# Patient Record
Sex: Female | Born: 1950 | Race: Black or African American | Hispanic: No | Marital: Married | Smoking: Never smoker
Health system: Southern US, Community
[De-identification: ages and names within clinical notes are randomized; demographics above are authoritative.]

## PROBLEM LIST (undated history)

## (undated) DIAGNOSIS — E785 Hyperlipidemia, unspecified: Secondary | ICD-10-CM

## (undated) DIAGNOSIS — T7840XA Allergy, unspecified, initial encounter: Secondary | ICD-10-CM

## (undated) DIAGNOSIS — I10 Essential (primary) hypertension: Secondary | ICD-10-CM

## (undated) HISTORY — DX: Essential (primary) hypertension: I10

## (undated) HISTORY — PX: ABDOMINAL HYSTERECTOMY: SHX81

## (undated) HISTORY — DX: Allergy, unspecified, initial encounter: T78.40XA

## (undated) HISTORY — PX: ABDOMINAL HYSTERECTOMY: SUR658

## (undated) HISTORY — DX: Hyperlipidemia, unspecified: E78.5

## (undated) HISTORY — PX: EYE SURGERY: SHX253

---

## 1997-12-21 ENCOUNTER — Other Ambulatory Visit: Admission: RE | Admit: 1997-12-21 | Discharge: 1997-12-21 | Payer: Self-pay | Admitting: Obstetrics and Gynecology

## 2001-03-06 ENCOUNTER — Encounter: Payer: Self-pay | Admitting: Family Medicine

## 2001-03-06 ENCOUNTER — Ambulatory Visit (HOSPITAL_COMMUNITY): Admission: RE | Admit: 2001-03-06 | Discharge: 2001-03-06 | Payer: Self-pay | Admitting: Family Medicine

## 2001-10-07 ENCOUNTER — Other Ambulatory Visit: Admission: RE | Admit: 2001-10-07 | Discharge: 2001-10-07 | Payer: Self-pay | Admitting: Obstetrics and Gynecology

## 2002-02-05 ENCOUNTER — Encounter (INDEPENDENT_AMBULATORY_CARE_PROVIDER_SITE_OTHER): Payer: Self-pay

## 2002-02-05 ENCOUNTER — Ambulatory Visit (HOSPITAL_COMMUNITY): Admission: RE | Admit: 2002-02-05 | Discharge: 2002-02-05 | Payer: Self-pay | Admitting: Obstetrics and Gynecology

## 2002-04-25 ENCOUNTER — Encounter: Payer: Self-pay | Admitting: Obstetrics and Gynecology

## 2002-04-25 ENCOUNTER — Ambulatory Visit (HOSPITAL_COMMUNITY): Admission: RE | Admit: 2002-04-25 | Discharge: 2002-04-25 | Payer: Self-pay | Admitting: Obstetrics and Gynecology

## 2003-01-27 ENCOUNTER — Other Ambulatory Visit: Admission: RE | Admit: 2003-01-27 | Discharge: 2003-01-27 | Payer: Self-pay | Admitting: Obstetrics and Gynecology

## 2003-06-16 ENCOUNTER — Encounter: Payer: Self-pay | Admitting: Internal Medicine

## 2003-06-16 ENCOUNTER — Inpatient Hospital Stay (HOSPITAL_COMMUNITY): Admission: AD | Admit: 2003-06-16 | Discharge: 2003-06-19 | Payer: Self-pay | Admitting: Obstetrics and Gynecology

## 2003-06-17 ENCOUNTER — Encounter: Payer: Self-pay | Admitting: Obstetrics & Gynecology

## 2003-06-18 ENCOUNTER — Encounter: Admission: RE | Admit: 2003-06-18 | Discharge: 2003-06-18 | Payer: Self-pay | Admitting: Internal Medicine

## 2003-07-01 ENCOUNTER — Ambulatory Visit: Admission: RE | Admit: 2003-07-01 | Discharge: 2003-07-01 | Payer: Self-pay | Admitting: Gynecology

## 2003-07-09 ENCOUNTER — Encounter (INDEPENDENT_AMBULATORY_CARE_PROVIDER_SITE_OTHER): Payer: Self-pay | Admitting: Specialist

## 2003-07-09 ENCOUNTER — Inpatient Hospital Stay (HOSPITAL_COMMUNITY): Admission: RE | Admit: 2003-07-09 | Discharge: 2003-07-11 | Payer: Self-pay | Admitting: Obstetrics and Gynecology

## 2003-07-09 ENCOUNTER — Encounter (INDEPENDENT_AMBULATORY_CARE_PROVIDER_SITE_OTHER): Payer: Self-pay | Admitting: *Deleted

## 2004-07-25 ENCOUNTER — Ambulatory Visit (HOSPITAL_COMMUNITY): Admission: RE | Admit: 2004-07-25 | Discharge: 2004-07-25 | Payer: Self-pay | Admitting: Obstetrics and Gynecology

## 2004-08-15 ENCOUNTER — Ambulatory Visit: Payer: Self-pay | Admitting: Gastroenterology

## 2004-08-18 ENCOUNTER — Ambulatory Visit: Payer: Self-pay | Admitting: Gastroenterology

## 2005-07-14 ENCOUNTER — Ambulatory Visit: Payer: Self-pay | Admitting: Gastroenterology

## 2005-07-28 ENCOUNTER — Ambulatory Visit: Payer: Self-pay | Admitting: Gastroenterology

## 2005-08-01 ENCOUNTER — Ambulatory Visit (HOSPITAL_COMMUNITY): Admission: RE | Admit: 2005-08-01 | Discharge: 2005-08-01 | Payer: Self-pay | Admitting: Obstetrics and Gynecology

## 2006-10-10 ENCOUNTER — Ambulatory Visit (HOSPITAL_COMMUNITY): Admission: RE | Admit: 2006-10-10 | Discharge: 2006-10-10 | Payer: Self-pay | Admitting: Obstetrics and Gynecology

## 2007-12-04 ENCOUNTER — Encounter: Admission: RE | Admit: 2007-12-04 | Discharge: 2007-12-04 | Payer: Self-pay | Admitting: Obstetrics and Gynecology

## 2009-01-22 ENCOUNTER — Ambulatory Visit (HOSPITAL_COMMUNITY): Admission: RE | Admit: 2009-01-22 | Discharge: 2009-01-22 | Payer: Self-pay | Admitting: Family Medicine

## 2010-02-10 ENCOUNTER — Ambulatory Visit (HOSPITAL_COMMUNITY): Admission: RE | Admit: 2010-02-10 | Discharge: 2010-02-10 | Payer: Self-pay | Admitting: Family Medicine

## 2010-07-19 ENCOUNTER — Encounter: Payer: Self-pay | Admitting: Gastroenterology

## 2010-07-25 ENCOUNTER — Encounter (INDEPENDENT_AMBULATORY_CARE_PROVIDER_SITE_OTHER): Payer: Self-pay | Admitting: *Deleted

## 2010-08-10 ENCOUNTER — Encounter (INDEPENDENT_AMBULATORY_CARE_PROVIDER_SITE_OTHER): Payer: Self-pay | Admitting: *Deleted

## 2010-08-12 ENCOUNTER — Ambulatory Visit
Admission: RE | Admit: 2010-08-12 | Discharge: 2010-08-12 | Payer: Self-pay | Source: Home / Self Care | Attending: Gastroenterology | Admitting: Gastroenterology

## 2010-08-26 ENCOUNTER — Other Ambulatory Visit: Payer: Self-pay | Admitting: Gastroenterology

## 2010-08-26 ENCOUNTER — Ambulatory Visit
Admission: RE | Admit: 2010-08-26 | Discharge: 2010-08-26 | Payer: Self-pay | Source: Home / Self Care | Attending: Gastroenterology | Admitting: Gastroenterology

## 2010-08-28 ENCOUNTER — Encounter: Payer: Self-pay | Admitting: Family Medicine

## 2010-09-02 ENCOUNTER — Encounter: Payer: Self-pay | Admitting: Gastroenterology

## 2010-09-08 NOTE — Letter (Signed)
Summary: Moviprep Instructions  [facility]  [address]   [place], [place] [address]   Phone: [phone]  Fax: [phone]       IZABELLE DAUS    1950/11/15    MRN: [id number]        Procedure Day /Date: 08-26-10     Arrival Time: 2:30 p.m.     Procedure Time: 3:30 p.m.     Location of Procedure:                    x   [facility] (4th Floor)   PREPARATION FOR COLONOSCOPY WITH MOVIPREP   Starting 5 days prior to your procedure 08-21-10 do not eat nuts, seeds, popcorn, corn, beans, peas,  salads, or any raw vegetables.  Do not take any fiber supplements (e.g. Metamucil, Citrucel, and Benefiber).  THE DAY BEFORE YOUR PROCEDURE         DATE: 08-25-10  DAY: Thursday  1.  Drink clear liquids the entire day-NO SOLID FOOD  2.  Do not drink anything colored red or purple.  Avoid juices with pulp.  No orange juice.  3.  Drink at least 64 oz. (8 glasses) of fluid/clear liquids during the day to prevent dehydration and help the prep work efficiently.  CLEAR LIQUIDS INCLUDE: Water Jello Ice Popsicles Tea (sugar ok, no milk/cream) Powdered fruit flavored drinks Coffee (sugar ok, no milk/cream) Gatorade Juice: apple, white grape, white cranberry  Lemonade Clear bullion, consomm, broth Carbonated beverages (any kind) Strained chicken noodle soup Hard Candy                             4.  In the morning, mix first dose of MoviPrep solution:    Empty 1 Pouch A and 1 Pouch B into the disposable container    Add lukewarm drinking water to the top line of the container. Mix to dissolve    Refrigerate (mixed solution should be used within 24 hrs)  5.  Begin drinking the prep at 5:00 p.m. The MoviPrep container is divided by 4 marks.   Every 15 minutes drink the solution down to the next mark (approximately 8 oz) until the full liter is complete.   6.  Follow completed prep with 16 oz of clear liquid of your choice (Nothing red or purple).   Continue to drink clear liquids until bedtime.  7.  Before going to bed, mix second dose of MoviPrep solution:    Empty 1 Pouch A and 1 Pouch B into the disposable container    Add lukewarm drinking water to the top line of the container. Mix to dissolve    Refrigerate  THE DAY OF YOUR PROCEDURE      DATE: 08-26-10 DAY: Friday  Beginning at 10:30 a.m. (5 hours before procedure):         1. Every 15 minutes, drink the solution down to the next mark (approx 8 oz) until the full liter is complete.  2. Follow completed prep with 16 oz. of clear liquid of your choice.    3. You may drink clear liquids until 1:30 p.m. (2 HOURS BEFORE PROCEDURE).   MEDICATION INSTRUCTIONS  Unless otherwise instructed, you should take regular prescription medications with a small sip of water   as early as possible the morning of your procedure.    Additional medication instructions: Do not take HCTZ day of procedure.  OTHER INSTRUCTIONS  You will need a responsible adult at least 61 years of age to accompany you and drive you home.   This person must remain in the waiting room during your procedure.  Wear loose fitting clothing that is easily removed.  Leave jewelry and other valuables at home.  However, you may wish to bring a book to read or  an iPod/MP3 player to listen to music as you wait for your procedure to start.  Remove all body piercing jewelry and leave at home.  Total time from sign-in until discharge is approximately 2-3 hours.  You should go home directly after your procedure and rest.  You can resume normal activities the  day after your procedure.  The day of your procedure you should not:   Drive   Make legal decisions   Operate machinery   Drink alcohol   Return to work  You will receive specific instructions about eating, activities and medications before you leave.    The above instructions have been reviewed and explained to me by    _______________________    I fully understand and can verbalize these instructions _____________________________ Date _________

## 2010-09-08 NOTE — Letter (Addendum)
Summary: Patient Notice- Polyp Results  [facility]  [address] [place], [place] [address]   Phone: [phone]  Fax: [phone]        September 02, 2010 MRN: [id number]    Taylor Holmes [address] [place], [place]  [address]    Dear [name],  I am pleased to inform you that the colon polyp(s) removed during your recent colonoscopy was (were) found to be benign (no cancer detected) upon pathologic examination.  I recommend you have a repeat colonoscopy examination in 5 years to look for recurrent polyps, as having colon polyps increases your risk for having recurrent polyps or even colon cancer in the future.  Should you develop new or worsening symptoms of abdominal pain, bowel habit changes or bleeding from the rectum or bowels, please schedule an evaluation with either your primary care physician or with me.  Continue treatment plan as outlined the day of your exam.  Please call us if you are having persistent problems or have questions about your condition that have not been fully answered at this time.  Sincerely,  Meryl Dare MD [facility]  This letter has been electronically signed by your physician.  Appended Document: Patient Notice- Polyp Results LETTER MAILED

## 2010-09-08 NOTE — Procedures (Addendum)
Summary: Colonoscopy  Patient: Taylor Holmes Note: All result statuses are Final unless otherwise noted.  Tests: (1) Colonoscopy (COL)   COL Colonoscopy           DONE     [facility]     [address]     [place], [place]  [address]           COLONOSCOPY PROCEDURE REPORT           PATIENT:  Taylor Holmes, Taylor Holmes  MR#:  [id number]     BIRTHDATE:  Dec 13, 1950, 59 yrs. old  GENDER:  female     ENDOSCOPIST:  Judie Petit T. Russella Dar, MD, [facility]           PROCEDURE DATE:  08/26/2010     PROCEDURE:  Colonoscopy with biopsy     ASA CLASS:  Class II     INDICATIONS:  1) Elevated Risk Screening  2) family history of     colon cancer: father at 42 and brother at 82.     MEDICATIONS:   Fentanyl 75 mcg IV, Versed 10 mg IV     DESCRIPTION OF PROCEDURE:   After the risks benefits and     alternatives of the procedure were thoroughly explained, informed     consent was obtained.  Digital rectal exam was performed and     revealed no abnormalities.   The LB [id number]-[id number] endoscope     was introduced through the anus and advanced to the cecum, which     was identified by both the appendix and ileocecal valve, without     limitations.  The quality of the prep was good, using MoviPrep.     The instrument was then slowly withdrawn as the colon was fully     examined.     <<PROCEDUREIMAGES>>           FINDINGS:  A sessile polyp was found in the mid transverse colon.     It was 4 mm in size. The polyp was removed using cold biopsy     forceps.  A sessile polyp was found in the sigmoid colon. It was 3     mm in size. The polyp was removed using cold biopsy forceps.  Mild     diverticulosis was found in the sigmoid colon.  A normal appearing     cecum, ileocecal valve, and appendiceal orifice were identified.     The ascending, hepatic flexure, splenic flexure, descending colon,     and rectum appeared unremarkable. Retroflexed views in the rectum     revealed no abnormalities.  The time to  cecum =  2  minutes. The     scope was then withdrawn (time =  12.67  min) from the patient and     the procedure completed.           COMPLICATIONS:  None           ENDOSCOPIC IMPRESSION:     1) 4 mm sessile polyp in the mid transverse colon     2) 3 mm sessile polyp in the sigmoid colon     3) Mild diverticulosis in the sigmoid colon           RECOMMENDATIONS:     1) Await pathology results     2) High fiber diet with liberal fluid intake.     3) Repeat Colonoscopy in 5 years pending pathology review           Judie Petit  Dellie Burns, MD, [facility]           CC:  Lynnea Ferrier, MD           n.     Rosalie DoctorVenita Lick. Chalsea Darko at 08/26/2010 03:38 PM           [place], [phone]  Note: An exclamation mark (!) indicates a result that was not dispersed into the flowsheet. Document Creation Date: 08/26/2010 3:38 PM _______________________________________________________________________  (1) Order result status: Final Collection or observation date-time: 08/26/2010 15:30 Requested date-time:  Receipt date-time:  Reported date-time:  Referring Physician:   Ordering Physician: Claudette Head [phone]) Specimen Source:  Source: Launa Grill Order Number: [id number] Lab site:   Appended Document: Colonoscopy     Procedures Next Due Date:    Colonoscopy: 08/2015

## 2010-09-08 NOTE — Letter (Signed)
Summary: Colonoscopy Letter  [facility]  [address]   [place], [place] [address]   Phone: [phone]  Fax: [phone]      July 19, 2010 MRN: [id number]   Taylor Holmes [address] [place], [place]  [address]   Dear Taylor Holmes,   According to your medical record, it is time for you to schedule a Colonoscopy. The American Cancer Society recommends this procedure as a method to detect early colon cancer. Patients with a family history of colon cancer, or a personal history of colon polyps or inflammatory bowel disease are at increased risk.  This letter has been generated based on the recommendations made at the time of your procedure. If you feel that in your particular situation this may no longer apply, please contact our office.  Please call our office at [phone] to schedule this appointment or to update your records at your earliest convenience.  Thank you for cooperating with [place] to provide you with the very best care possible.   Sincerely,   Claudette Head, M.D.  [facility] Gastroenterology Division [phone]

## 2010-09-08 NOTE — Letter (Signed)
Summary: Pre Visit Letter Revised  [facility]  [address] [place], [place] [address]   Phone: [phone]  Fax: [phone]        07/25/2010 MRN: [id number] Taylor Holmes [address] [place], [place]  [address]             Procedure Date:  08/26/2010   Welcome to the Gastroenterology Division at [facility].    You are scheduled to see a nurse for your pre-procedure visit on 08/12/2010 at 4:30 on the 3rd floor at [facility], [address]  We ask that you try to arrive at our office 15 minutes prior to your appointment time to allow for check-in.  Please take a minute to review the attached form.  If you answer "Yes" to one or more of the questions on the first page, we ask that you call the person listed at your earliest opportunity.  If you answer "No" to all of the questions, please complete the rest of the form and bring it to your appointment.    Your nurse visit will consist of discussing your medical and surgical history, your immediate family medical history, and your medications.   If you are unable to list all of your medications on the form, please bring the medication bottles to your appointment and we will list them.  We will need to be aware of both prescribed and over the counter drugs.  We will need to know exact dosage information as well.    Please be prepared to read and sign documents such as consent forms, a financial agreement, and acknowledgement forms.  If necessary, and with your consent, a friend or relative is welcome to sit-in on the nurse visit with you.  Please bring your insurance card so that we may make a copy of it.  If your insurance requires a referral to see a specialist, please bring your referral form from your primary care physician.  No co-pay is required for this nurse visit.     If you cannot keep your appointment, please call [phone] to cancel or reschedule prior to your appointment date.  This allows  [place] the opportunity to schedule an appointment for another patient in need of care.    Thank you for choosing [facility] for your medical needs.  We appreciate the opportunity to care for you.  Please visit [place] at our website  to learn more about our practice.  Sincerely, The Gastroenterology Division

## 2010-09-08 NOTE — Miscellaneous (Signed)
Summary: [facility] PV  Clinical Lists Changes  Medications: Added new medication of MOVIPREP 100 GM  SOLR (PEG-KCL-NACL-NASULF-NA ASC-C) As per prep instructions. - Signed Rx of MOVIPREP 100 GM  SOLR (PEG-KCL-NACL-NASULF-NA ASC-C) As per prep instructions.;  #1 x 0;  Signed;  Entered by: Ezra Sites RN;  Authorized by: Meryl Dare MD [facility];  Method used: [facility] [id number] [address] [place], [place], [place]  [address], Ph: [phone], Fax: [phone] Observations: Added new observation of NKA: T (08/12/2010 16:11)    Prescriptions: MOVIPREP 100 GM  SOLR (PEG-KCL-NACL-NASULF-NA ASC-C) As per prep instructions.  #1 x 0   Entered by:   Ezra Sites RN   Authorized by:   Meryl Dare MD [facility]   Signed by:   Ezra Sites RN on 08/12/2010   Method used:   [facility]  [address] [id number] ([facility]ail)       [address]       [place], [place]  [address]       Ph: [phone]       Fax: [phone]   RxID:   [id number]

## 2010-12-23 NOTE — Discharge Summary (Signed)
NAME:  Taylor Holmes, Taylor Holmes                      ACCOUNT NO.:  [id number]   MEDICAL RECORD NO.:  [id number]                   PATIENT TYPE:  INP   LOCATION:  9126                                 FACILITY:  [facility]   PHYSICIAN:  Osborn Coho, M.D.                DATE OF BIRTH:  15-Mar-1951   DATE OF ADMISSION:  06/16/2003  DATE OF DISCHARGE:  06/19/2003                                 DISCHARGE SUMMARY   CHIEF COMPLAINT UPON ADMISSION:  Left lower quadrant pain, nausea, and  vomiting.   ADMITTING DIAGNOSIS:  1. Left lower quadrant pain.  2. Left ovarian vein thrombosis.  3. Questionable findings of liver lesion.   DISCHARGE DIAGNOSES:  1. Left ovarian vein thrombosis.  2. Resolution of left lower quadrant pain.   HOSPITAL COURSE:  [name] was admitted on June 16, 2003.  She is a  60 year old gravida 4 para 2 who initially presented to [facility]  in [place] and was transferred to the [facility] secondary to being  a patient of Dr. Debria Garret.  The patient complained of worsening left lower  quadrant pain, nausea, and vomiting acutely that morning.  The patient was  admitted and treated with pain medicine and antiemetics.  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  The doctor at [facility] had a  question of ovarian torsion so an ultrasound was performed at the [facility] that showed no evidence of torsion and normal bilateral ovaries  with small simple left ovarian cyst and small right ovarian cyst with normal  bilateral blood flow.  The suggestion was made by radiology to do an MRI to  evaluate the liver lesions further.  The MRI showed the liver lesions to be  benign.  A hematology consult was called and Dr. Sharon Seller provided the  consultation.  The patient was anticoagulated secondary to left ovarian vein  thrombosis and pain was noted to resolve.  The patient was  discharged on  November 12 doing well without any further pain and was scheduled to follow  up with Dr. Stanford Breed and Dr. Stefano Gaul in two weeks.  The ultimate plan  was to consider hysterectomy on this patient.  The patient had a CA125 done  that was also to be repeated at [facility] on November 21.   DISCHARGE INSTRUCTIONS:  The patient to follow up in two weeks with Dr.  Stefano Gaul and Dr. Stanford Breed for further evaluation.  No further  anticoagulation was necessary and that was in agreement with hematologist,  Dr. Sharon Seller.  Follow-up was scheduled for two weeks.   DISCHARGE MEDICINES:  Prevacid, hydrochlorothiazide, and Percocet.  Osborn Coho, M.D.    AR/MEDQ  D:  08/06/2003  T:  08/06/2003  Job:  [id number]

## 2010-12-23 NOTE — Op Note (Signed)
[facility]  Patient:    Taylor Holmes, Taylor Holmes Visit Number: [id number] MRN: [id number]          Service Type: DSU Location: [facility] Attending Physician:  Leonard Schwartz Dictated by:   Janine Limbo, M.D. Proc. Date: 02/05/02 Admit Date:  02/05/2002 Discharge Date: 02/05/2002   CC:         Dr. Dorothey Baseman   Operative Report  PREOPERATIVE DIAGNOSES:       1. A 12 week size fibroid uterus.                               2. Menorrhagia.  POSTOPERATIVE DIAGNOSES:      1. A 12 week size fibroid uterus.                               2. Menorrhagia.                               3. Endometrial polyps.  OPERATION:                    1. Hysteroscopy.                               2. Dilatation and curettage.  SURGEON:                      Janine Limbo, M.D.  ANESTHESIA:                   Monitored anesthetic control and paracervical                               block using 0.5% Marcaine.  DISPOSITION:                  The patient is a 60 year old female with a known history of fibroids.  She is having heavy uterine bleeding.  She does not desire hysterectomy, and she is hoping that she will indeed become postmenopausal soon, and that her cycles will naturally stop on their own. She understands the indications for todays procedure, and she accepts the risks of, but not limited to anesthetic complications, bleeding, infection, and possible damage to the surrounding organs.  FINDINGS:                     The uterus was 12 weeks size and it contained multiple fibroids.  Inspection of the endometrial cavity showed endometrial polyps.  DESCRIPTION OF PROCEDURE:     The patient was taken to the operating room where she was given medication through her IV line.  The patients abdomen, perineum, and vagina were prepped with multiple layers of Betadine.  A Foley catheter was placed in the bladder.  The patient was sterilely  draped. Examination under anesthesia was performed.  A paracervical block was placed using 10 cc of 0.5% Marcaine.  The endocervical canal was then curetted.  The uterus sounded to 12 cm.  The cervix was gradually dilated.  The diagnostic hysteroscope was inserted and the cavity was inspected.  The patient was found to have polyps within the endometrial cavity.  The hysteroscope was removed and  the cavity was then curetted until it was felt to be clean.  The cavity was also explored using Randall stone forceps.  The polyps were removed.  The hysteroscope was inserted once again and the cavity did not show evidence of any further polyps.  The cavity appeared to be relatively clear.  The instruments were removed.  The patient was returned to the supine position and then awakened from her anesthetic.  She was taken to the recovery room in stable condition.  She tolerated her procedure well.  The estimated blood loss was 30 cc.  There was 140 cc fluid deficit during hysteroscopy.  FOLLOWUP INSTRUCTIONS:        The patient will return to see Dr. Stefano Gaul in two to three weeks for follow up examination.  She was given a prescription for Tylenol No. 3 and she will take one to two tablets every four hours as needed for pain.  She was given a copy of the postoperative instruction sheet as prepared by the [facility] for patients who have undergone a dilatation and curettage. Dictated by:   Janine Limbo, M.D. Attending Physician:  Leonard Schwartz DD:  02/05/02 TD:  02/08/02 Job: [id number]

## 2010-12-23 NOTE — Discharge Summary (Signed)
NAME:  Taylor Holmes, Taylor Holmes                      ACCOUNT NO.:  [id number]   MEDICAL RECORD NO.:  [id number]                   PATIENT TYPE:  INP   LOCATION:  0466                                 FACILITY:  [facility]   PHYSICIAN:  Janine Limbo, M.D.            DATE OF BIRTH:  1951-07-26   DATE OF ADMISSION:  07/09/2003  DATE OF DISCHARGE:  07/11/2003                                 DISCHARGE SUMMARY   DISCHARGE DIAGNOSES:  1. A 12 week size fibroid uterus.  2. Dysmenorrhea.  3. Pelvic pain.  4. Pelvic adhesions.  5. Ovarian cyst.  6. Postoperative anemia (hemoglobin 9.8).  7. History of left ovarian vein thrombosis.   PROCEDURES:  On July 09, 2003, total abdominal hysterectomy, bilateral  salpingo-oophorectomy, cystoscopy.   HISTORY OF PRESENT ILLNESS:  Taylor Holmes is a 60 year old female, para 2-  0-2-2, who was admitted with the above mentioned diagnoses.  The patient has  a history of left ovarian vein thrombosis and she was noted to have an  elevated CA-125.  She was seen in consultation by a GYN oncologist, and  because her CA-125 declined spontaneously without treatment other than  anticoagulation, it was believed that malignancy was extremely unlikely.  Nevertheless, the patient did want to proceed with surgery because of many  years of discomfort and because of her pelvic pain.  Please see our dictated  history and physical exam for details.   HOSPITAL COURSE:  On the day of admission, the patient underwent a total  abdominal hysterectomy and bilateral salpingo-oophorectomy.  Operative  findings included a 12 week size multi-fibroid uterus, normal fallopian  tubes, and normal ovaries except for moderate adhesions between the ovaries  and the side walls.  There was no evidence of thrombosis of the left ovarian  vein.  Cystoscopy was performed because of the pelvic adhesive disease, and  her ureters were thought to be normal.  The patient tolerated her procedure  well.  Her postoperative hemoglobin was 9.8 (preoperative hemoglobin was  12.7).  The patient did have a temperature to 100.5 on postoperative day #2,  but then she quickly defervesced.  She tolerated a regular diet by  postoperative day #2.  Her pain medications were thought to be adequate.  The decision was made to discharge the patient home on postoperative day #2.  She was in stable condition.   DISCHARGE MEDICATIONS:  1. Vicodin one or two tablets every four hours as needed for pain.  2. Phenergan 25 mg every six hours as needed for nausea.  3. Iron 325 mg twice each day for six weeks.  4. Effexor 25 mg one tablet twice each day for management of hot flashes.   DISCHARGE INSTRUCTIONS:  1. The patient will return to see Dr. Stefano Holmes in two to three days to have     the abdominal staples removed and her Jackson-Pratt drain removed.  2. She will return in four to six weeks  for followup examination.  3. She will refrain from driving for two weeks, heavy lifting for four     weeks, and intercourse for six weeks.  4. She will increase the fiber and the fluids in her diet because of her     iron therapy.  5. She will call for questions or concerns.   Final pathology report:  255 g uterus with multiple fibroids.  The ovaries  were without pathology.  Cytology fluid from the pelvis showed no evidence  of malignant cells.                                               Janine Limbo, M.D.    AVS/MEDQ  D:  07/30/2003  T:  07/30/2003  Job:  [id number]   cc:   Taylor Holmes, M.D.

## 2010-12-23 NOTE — H&P (Signed)
NAME:  Taylor Holmes, Taylor Holmes                      ACCOUNT NO.:  [id number]   MEDICAL RECORD NO.:  [id number]                   PATIENT TYPE:  INP   LOCATION:  NA                                   FACILITY:  [facility]   PHYSICIAN:  Janine Limbo, M.D.            DATE OF BIRTH:  August 01, 1951   DATE OF ADMISSION:  07/09/2003  DATE OF DISCHARGE:                                HISTORY & PHYSICAL   HISTORY OF PRESENT ILLNESS:  Ms. Sandstrom is a 61 year old female, para 2-  0-0-2, who is admitted to the [facility] for a total abdominal  hysterectomy and bilateral salpingo-oophorectomy.  The patient has been  followed at the [facility] and Gynecology division of  [facility] for this pregnancy that has been complicated  by a long history of fibroids.  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  This  was associated with severe pain.  That pain has subsequently resolved.  The  patient was briefly treated with Lovenox but long-term therapy was not  thought to be necessary.   As a part of that evaluation, the patient had a CA125 performed with a value  of 400.  A repeat value, however, was 109.  The patient was seen by a GYN  oncologist and the thought was that she most likely had an elevated CA125  because of her fibroids and the left ovarian vein syndrome.  A CT scan of  the pelvis did not reveal any other signs of potential malignancy.  It was  felt that no further cancer evaluation was necessary.   The patient does have a history of severe dysmenorrhea each month.  She has  had a prior D&C in the past.  She has also had a tubal ligation in the past.   OBSTETRICAL HISTORY:  The patient has had two term vaginal deliveries and  two elective pregnancy terminations.   ALLERGIES:  None known.   PAST MEDICAL HISTORY:  1. Hypertension.  2. Gastroesophageal reflux disease.   CURRENT MEDICATIONS:  Travatan, hydrochlorothiazide, and Prevacid.   REVIEW OF SYMPTOMS:  Please see history of present illness.   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  She denies cigarette use,  alcohol use, and recreational drug use.   FAMILY HISTORY:  The patient has a brother with colon cancer.  There is a  question of whether or not her mother had cervix cancer.  The patient has an  aunt with breast cancer.   PHYSICAL EXAMINATION:  VITAL SIGNS:  Weight is 145 pounds.  Height 5 feet 2  inches.  HEENT:  Within normal limits.  CHEST:  Clear.  HEART:  Regular rate and rhythm.  BREASTS:  Without masses.  ABDOMEN:  Soft and nontender.  There is no evidence of ascites present.  No  masses are appreciated.  EXTREMITIES:  Grossly normal.  NEUROLOGICAL:  Grossly normal.  PELVIC:  External genitalia is normal.  The vagina is normal and nontender.  Cervix is normal.  The uterus 12 to 14 weeks size, irregular, and mobile.  Adnexa shows no masses appreciated.  Rectovaginal exam confirms.   ASSESSMENT:  1. A 12 to 14-week sized fibroid uterus.  2. Severe dysmenorrhea.   PLAN:  The patient will undergo a total abdominal hysterectomy and bilateral  salpingo-oophorectomy.  She understands the indications for her procedure  and she accepts the risks of, but not limited to, anesthetic complications,  bleeding, infections, and possible damage to the surrounding organs.                                               Janine Limbo, M.D.    AVS/MEDQ  D:  07/08/2003  T:  07/08/2003  Job:  [id number]   cc:   Elvina Sidle, M.D.  [address] [place]  [place] [address]  Fax: [phone]

## 2010-12-23 NOTE — Op Note (Signed)
NAME:  Taylor Holmes, Taylor Holmes                      ACCOUNT NO.:  [id number]   MEDICAL RECORD NO.:  [id number]                   PATIENT TYPE:  INP   LOCATION:  0466                                 FACILITY:  [facility]   PHYSICIAN:  Janine Limbo, M.D.            DATE OF BIRTH:  12/30/1950   DATE OF PROCEDURE:  07/09/2003  DATE OF DISCHARGE:                                 OPERATIVE REPORT   PREOPERATIVE DIAGNOSES:  1. 12 week fibroid uterus.  2. Dysmenorrhea.  3. Pelvic pain.   POSTOPERATIVE DIAGNOSES:  1. 12 week fibroid uterus.  2. Dysmenorrhea.  3. Pelvic pain.  4. Pelvic adhesions.   PROCEDURE:  1. Total abdominal hysterectomy.  2. Bilateral salpingo-oophorectomy.  3. Cystoscopy.   SURGEON:  Janine Limbo, M.D.   FIRST ASSISTANT:  Elmira J. Adline Peals.   ANESTHESIA:  General.   DISPOSITION:  Ms. Higuchi is a 60 year old female, para 2-0-2-2, who  presents for a total abdominal hysterectomy and bilateral salpingo-  oophorectomy. The patient has been followed at the [facility] and Gynecology Division of [facility] for a  long history of fibroids. She was recently admitted to the hospital because  of abdominal pain. 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  This was associated with severe abdominal pain. She was treated with Lovenox  and the pain subsequently improved but did not completely resolve. After a  part of her evaluation, she was found to have a negative lupus anticoagulant  and negative studies for clotting factors. She was, however, found to have a  CA 125 that was 400. Repeat value was 109. The patient was evaluated by a  GYN oncologist and it was thought that the elevation in the CA 125 was most  likely a result of her fibroid and the left ovarian vein thrombosis. It was  felt that a malignancy was extremely unlikely. In addition, she did have a  CT scan and an MRI of her abdomen and pelvis and no  suspicious lesions were  noted. There was no evidence of ascites. The patient presents at this time  for definitive therapy. She understands the indications for her procedure  and she also accepts the risks of, but not limited to, anesthetic  complications, bleeding, infections, and possible damage to the surrounding  organs.   FINDINGS:  The patient was noted to have a multifibroid, 12 week size  fibroid uterus. The fallopian tubes were normal except for defects from her  prior tubal ligation. The patient's ovaries appeared normal except for  moderate adhesions to the pelvic sidewalls. The bowel was carefully  inspected and there was no evidence of abnormality there. The periaortic  area was palpated and there was no adenopathy present. There was no  adenopathy along the iliac vessels. The upper abdomen was palpated and there  was no evidence of malignancy. There was no nodularity along the liver  or  the diaphragm. On cystoscopy, there were no lesions noted within the  bladder. Blue dye was seen from the ureters.   DESCRIPTION OF PROCEDURE:  The patient was taken to the operating room where  a general anesthetic was given. The patient's abdomen, perineum, and vagina  were prepped with multiple layers of Betadine. A Foley catheter was placed  in the bladder. The patient was sterilely draped. The lower abdomen was  injected with 10 mL of 0.25% Marcaine without epinephrine.  A low transverse  incision was made in the abdomen and carried sharply through the  subcutaneous tissue, the fascia, and the anterior peritoneum. Washings were  obtained from the abdominal cavity. These were sent to cytology for  evaluation. The upper abdomen was explored and no lesions were noted. The  bowel was explored and no lesions were noted. An abdominal wall retractor  was placed. The bowel was packed out of the pelvis. The uterus was elevated  into our operative field. The ureters were isolated bilaterally and  followed  through their course. They were felt to be away from our areas for surgery.  The round ligaments were clamped bilaterally, cut, suture ligated, and held  to the side. The uteroovarian ligaments were isolated bilaterally, clamped,  cut, sutured, and held to the side. The bladder flap was developed  anteriorly. Alternating from left to right, the uterine arteries,  parametrial tissues, paracervical tissues, uterosacral ligaments, and then  the vaginal angles were clamped, cut, sutured, and tied securely. The uterus  was transected from the apex of the vagina. The apex of the vagina was  closed using figure-of-eight sutures. There was some bleeding and hemostasis  was achieved using the Bovie cautery and free ties. We then isolated the  right infundibulopelvic ligament. The ligament was clamped, cut, free tied  and suture ligated. Hemostasis was noted to be adequate. An identical  procedure was carried out on the opposite side. Once again hemostasis was  adequate. The pelvis was irrigated. The pelvis was reperitonealized using a  running suture of 3-0 Vicryl. At this point, we felt we were finished with  the abdominal procedure. All instruments were removed. The anterior  peritoneum was closed using 3-0 Vicryl. The subcutaneous layer, the fascia  and the abdominal musculature were irrigated. Hemostasis was adequate. The  fascia was closed using a running suture of #0 Vicryl followed by three  interrupted sutures of #0 Vicryl. A Jackson-Pratt drain was placed in the  bottom of the subcutaneous layer and brought out through the right lower  quadrant. It was sutured into place using 3-0 silk. The subcutaneous layer  was closed using a running suture of #0 Vicryl. The skin was reapproximated  using skin staples. Sponge, needle, and instrument counts were correct on  two occasions. The estimated blood loss was 400 mL. The patient was noted to drain clear yellow urine. The patient was then  given 2 ampules of indigo  carmine dye. The diagnostic hysteroscope was inserted into the bladder and  the mucosa was carefully evaluated. Dye was noted to pass through the  ureters. The Foley catheter was then reinserted and the patient was awakened  from her anesthetic. She was taken to the recovery room in stable condition.  She was noted to drain clear, blue urine at this point. Sponge, needle and  instrument counts were correct for the procedure. #0 Vicryl was the suture  material used throughout except where otherwise mentioned.  Janine Limbo, M.D.    AVS/MEDQ  D:  07/09/2003  T:  07/10/2003  Job:  [id number]

## 2010-12-23 NOTE — Consult Note (Signed)
NAME:  Taylor Holmes, BLUMER                      ACCOUNT NO.:  [id number]   MEDICAL RECORD NO.:  [id number]                   PATIENT TYPE:  OUT   LOCATION:  GYN                                  FACILITY:  [facility]   PHYSICIAN:  De Blanch, M.D.         DATE OF BIRTH:  09/25/1950   DATE OF CONSULTATION:  07/01/2003  DATE OF DISCHARGE:                                   CONSULTATION   [age] year-old African-American seen in consultation at the request of  Dr. Dierdre Forth.  The patient developed left lower quadrant pain  recently and in the course of evaluation a CT scan was obtained showing a  left ovarian vein thrombosis.  The patient was treated with four days of  Lovenox and then discharged from the hospital.  She was also found to have  uterine fibroids which are of longstanding duration.  The patient reports  that her pain has improved considerably since using the Lovenox, she has no  swelling in her legs.  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  There is no evidence of malignancy on either of these  scans.  The patient continues to have regular cyclic menstrual periods  although she has moderately severe dysmenorrhea each month.  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  Mammograms  have been negative.   PAST MEDICAL HISTORY:  Medical illnesses:  1. Hypertension.  2. Gastroesophageal reflux disease.   DRUG ALLERGIES:  NONE.   CURRENT MEDICATIONS:  Travatan, hydrochlorothiazide and Prevacid.   PAST SURGICAL HISTORY:  D&C.   FAMILY HISTORY:  Reveals a patient whose mother possibly had cervix cancer,  although this was treated with a simple hysterectomy.  She has an aunt with  breast cancer and a brother with colon cancer.   SOCIAL HISTORY:  Patient is married.  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   REVIEW OF SYSTEMS:  Otherwise negative.   PHYSICAL EXAMINATION:  Weight 145 lb., height 5 feet 2 inches, blood  pressure 140/80, pulse 80.  GENERAL:  Patient is a healthy African-American female in no acute distress.  HEENT:  Negative.  NECK:  Supple without thyromegaly.  There is no supraclavicular or inguinal  adenopathy.  ABDOMEN:  Soft, nontender.  No mass, organomegaly, ascites or hernia is  noted.  PELVIC EXAM:  EG/BUS, vagina, bladder, urethra are normal.  The cervix is  normal.  Uterus is anterior, approximately [redacted] weeks gestational size,  slightly irregular, freely mobile.  No adnexal masses are noted.  RECTOVAGINAL EXAM:  Confirms.   LABORATORY WORK:  It is noted the CA-125 from the patient at the time of her  ovarian vein thrombosis diagnosis was over 400, however repeat CA-125  earlier this week was down to 109.  In addition, she has had an ultrasound  which shows both ovaries to be normal and there is no evidence of  significant ascites.   IMPRESSION:  1. Uterine fibroids with dysmenorrhea.  2. Recent episode of ovarian vein thrombosis of questionable etiology.  3. Elevated CA-125 most likely associated with fibroids and prior elevation     secondary to phlebitis.   PLAN:  I agree with Dr. Lilian Coma assessment the patient would benefit from  hysterectomy in that she has severe dysmenorrhea.   Prior to surgery I would suggest that she undergo further workup for a  possible thrombophilia such as Factor V Leiden or protein A or C or  antiphospholipid syndrome. (The reason for this is that there is no clear  evidences to the etiology of her ovarian vein thrombosis).  Patient returned  to the care of Dr. Pennie Rushing.  All of her questions are answered.                                               De Blanch, M.D.    DC/MEDQ  D:  07/01/2003  T:  07/01/2003  Job:  [id number]   cc:   Hal Morales, M.D.  [address]  [place]  [place] [address]   Fax: [phone]

## 2010-12-23 NOTE — H&P (Signed)
[facility]  Patient:    Taylor Holmes, Taylor Holmes Visit Number: [id number] MRN: [id number]          Service Type: DSU Location: [facility] Attending Physician:  Leonard Schwartz Dictated by:   Janine Limbo, M.D. Admit Date:  02/05/2002   CC:         Elvina Sidle, M.D.   History and Physical  HISTORY OF PRESENT ILLNESS:   The patient is a 60 year old female who presents for a hysteroscopy with D&C because of menorrhagia.  The patient has a known history of fibroids.  Her endometrial biopsy was benign.  Her most recently Pap smear was within normal limits.  DRUG ALLERGIES:               None known.  PAST MEDICAL HISTORY:         The patient has a history of reflux disease and she currently takes Nexium.  She also takes multivitamins.  REVIEW OF SYSTEMS:            Noncontributory.  FAMILY HISTORY:               Noncontributory.  PHYSICAL EXAMINATION:  GENERAL:                      Weight is 150 pounds.  HEENT:                        Within normal limits.  CHEST:                        Clear.  HEART:                        Regular rate and rhythm.  BREASTS:                      Her breasts are without masses.  ABDOMEN:                      Nontender.  EXTREMITIES:                  Within normal limits.  NEUROLOGIC:                   Exam is grossly normal.  PELVIC:                       External genitalia is normal.  Vagina is normal. Cervix is nontender.  Uterus is 12 weeks size to 14 weeks size, irregular, and firm.  Adnexa within normal limits and rectovaginal exam confirms.  ASSESSMENT: 1. Menorrhagia. 2. Fibroid uterus.  PLAN:                         The patient will undergo a hysteroscopy with dilatation and curettage.  She understands the indications for her procedure and she accepts the risks of, but not limited to, anesthetic complications, bleeding, infections, and possible damage to the surrounding organs.   Dictated by:   Janine Limbo, M.D. Attending Physician:  Leonard Schwartz DD:  02/04/02 TD:  02/04/02 Job: [id number]

## 2010-12-23 NOTE — Consult Note (Signed)
NAME:  Taylor Holmes, Taylor Holmes                      ACCOUNT NO.:  [id number]   MEDICAL RECORD NO.:  [id number]                   PATIENT TYPE:  INP   LOCATION:  9126                                 FACILITY:  [facility]   PHYSICIAN:  Taylor Holmes, M.D.            DATE OF BIRTH:  Apr 26, 1951   DATE OF CONSULTATION:  DATE OF DISCHARGE:                                   CONSULTATION   REASON FOR CONSULTATION:  Anticoagulation for left ovarian vein thrombus.   HISTORY OF PRESENT ILLNESS:  Taylor Holmes is a very pleasant 60 year old  female who originally presented to [facility] emergency room/[facility] June 16, 2003 with complaints of severe left lower quadrant  abdominal pain.  Workup there proceed with a CT scan which revealed a left  ovarian vein thrombus.  For this reason she was transferred to [facility] to the service of Taylor Holmes.  Further workup has been  pursued to include evaluation of questionable cyst within the body of the  liver.  MRI results are not available to me at this time but it has been  reported to me that these are felt to be benign.  Further testing has  included a transvaginal and transabdominal pelvic ultrasound.  Ovarian vein  thrombus was not able to be visualized by ultrasound but fibroid uterus was  identified and small bilateral ovarian cysts were able to be identified.  There was specifically no evidence of ovarian torsion.  At this time the  patient is resting comfortably in bed.  She reports being given pain  medication on Friday.  Her symptoms have resolved.  She specifically denies  any abdominal pain, nausea or vomiting, shortness of breath, or chest pain  at this time.   REVIEW OF SYSTEMS:  The patient does report intermittent episodes of gas-  like chest pain over the last two to three months.  She specifically denies  weight loss.  She has an extensive family history of colon cancer but has  had a colonoscopy within  the last four years and reports that this was  normal.  The patient has been experiencing irregularity in her menstrual  flow but she relates this to menopause.  There has not been any increase in  volume of Holmes but it has become much less predictable is minimal compared  to her usual.  Full review of systems is otherwise unremarkable.   OUTPATIENT MEDICATIONS:  1. Prevacid.  2. Hydrochlorothiazide.   ALLERGIES:  No known drug allergies.   FAMILY HISTORY:  The patient has an extensive history of colon cancer in her  family.  Her father and a brother have both been diagnosed with colon  cancer.  Her maternal grandfather also had colon cancer.  She also has an  aunt on her mother's side who has been diagnosed with colon cancer.  The  patient reports that she has had two colonoscopies both of which  have been  normal and she reports that her last colonoscopy was in 2000.   SOCIAL HISTORY:  The patient does not smoke, she does not drink.  She is  married.  She is currently employed.   DATA REVIEW:  PT and PTT are normal.  Urinalysis reveals small amount of  Holmes on dipstick and no micro is available.  LFTs are unremarkable.  Albumin is mildly decreased at 3.4, total protein is 6.4.  Electrolytes are  all balanced.  Lipase is 33, amylase 61.  Hemoglobin is 11.4 with MCV of 90,  white count and platelet count are both normal.  Urine hCG is negative.  CA125 is elevated at 404.  CEA is normal.  CA19-9 is normal.  Sed rate is  normal.  Antithrombin III level is normal.  TSH is normal.  Iron and percent  saturation are normal but total iron binding capacity is mildly decreased.  Ferritin is 151.  B12 is 762.  CT scan of the abdomen:  Left ovarian vein  thrombosis.  Multiple liver lesions.  Fibroids.   PHYSICAL EXAMINATION:  VITAL SIGNS:  Temperature 98.2, Holmes pressure  120/60, heart rate 86, respiratory rate 16.  GENERAL:  Well-developed, well-nourished female lying in hospital bed in  no  acute respiratory distress.  HEENT:  Normocephalic, atraumatic.  Pupils equal, round, and reactive to  light and accommodation.  Extraocular muscles intact bilaterally.  LUNGS:  Clear to auscultation throughout with mild bibasilar crackles  consistent with atelectasis and no wheeze.  CARDIOVASCULAR:  Regular rate and rhythm without murmur, gallop, or rub with  normal S1 and S2.  ABDOMEN:  Soft, bowel sounds present.  No hepatosplenomegaly.  Mild  tenderness to deep palpation left lower quadrant.  Bowel sounds positive.  EXTREMITIES:  No cyanosis, clubbing, edema bilateral lower extremities.  No  calf enlargement or erythema.  No Homans sign.  NEUROLOGIC:  Nonfocal neurologic exam.  Cranial nerves II-XII intact  bilaterally.  Alert and oriented x4.   IMPRESSION AND PLAN/RECOMMENDATIONS:  #1 - LEFT OVARIAN VEIN THROMBOSIS.  I  am asked to see Taylor Holmes in consultation to recommend anticoagulation  regimen for Taylor Holmes's left ovarian vein thrombosis.  Literature  review per UpToDate reveals that anticoagulation is definitely recommended  in the acute setting.  As a result, I have initiated Lovenox therapy at 1  mg/kg subcu q.12h. for full anticoagulation.  I am concerned about the  possible etiology of this patient's ovarian vein thrombus as I know the  primary service is as well.  I have undertaken a full workup for a  hypercoagulable state.  The patient has not received any anticoagulants of  any kind prior to my visit and therefore I am able to obtain protein C and S  and antithrombin III as well.  As the patient is of African-American descent  I will not obtain a factor V Leiden or a prothrombin gene mutation.  I have  asked for homocysteine level.  ANA is pending to assure that this is not  related to a lupus anticoagulant or antiphospholipid antibody syndrome.  My  other concern is that this could represent the presence of an occult cancer in this nice lady.  CT scan  of the abdomen did reveal multiple liver  lesions.  I understand these have been followed up with an MRI of the  abdomen but the results of this are not available to me now.  The physician  requesting the consultation has informed me  that these were felt to be  benign cysts on MRI and we will simply need to follow up the final results  as dictated by radiology.  Numerous tumor markers have been obtained by the  primary service - please see discussion of this below.  Unfortunately at  this time I am not able to suggest a specific length of treatment with full  anticoagulation.  What little literature I am able to obtain regarding  ovarian vein thrombosis suggests that anticoagulation can be limited simply  to the symptomatic period of inpatient stay.  In Mrs. Darius's case  however I am not comfortable discharging her without anticoagulation as I am  concerned that we are not exactly clear as to the cause of her ovarian vein  thrombosis.  The information I could find is specific to septic pelvic  thrombophlebitis and does suggest that infection is often an accompanying  and actually causative factor with ovarian vein thrombosis.  The patient  does not give a history of consistent with an infection.  Occult infection  must be considered but I do not feel on clinical grounds that empiric  antibiotic therapy is indicated at this time.  Full review of systems is  accomplished by me and does reveal episodic gas-like chest discomfort.  This  is not the typical pain that one would elicit with pulmonary embolism.  The  patient is not having severe shortness of breath.  There is concern that  this patient could have experienced pulmonary embolism even from deep pelvic  thrombosis.  The patient is not symptomatic at this time but I feel that  ruling out pulmonary embolism is important because it will certainly affect  our course of anticoagulation.  I will therefore request a CT scan of the  chest  with pulmonary embolism protocol to be pursued.  This will also help  [place] rule out the possibility of an occult lung cancer which could even be a  cause for an elevated CA125.   #2 - ELEVATED CA125.  Holmes studies reveal a significantly elevated CA125 of  404.  This of course is concerning for the possibility of occult ovarian  cancer.  The ovaries did appear normal on ultrasound.  This is also a  relatively nonspecific finding and I question whether this could even be  related to the ovarian vein thrombosis in and of itself.  Other malignancies  could cause this elevation but it is also related oftentimes with benign  findings as well.  The primary service is certainly much more well-versed in  the management of an elevated CA125 than am I and I will therefore defer to  their expert care as to the extent of further workup.   #3 - NORMOCYTIC ANEMIA.  The patient has a significant normocytic anemia with hemoglobin 10. 7 to 11.4 and MCV of 90.  I have obtained iron studies  which revealed a decreased total iron binding capacity but normal iron and  percent saturation.  Ferritin is 151 and not consistent with iron  deficiency.  I have asked for stool guaiacs.  A concern certainly is colon  cancer with this patient's extensive history, but again her studies are not  consistent with iron deficiency at this time and the patient has had a  colonoscopy within the last four years that was reported to be normal.  I  will await stool guaiacs and pursue further workup as indicated.  My concern  would be that this could be an anemia  of chronic disease or related to an  occult cancer.   #4 - HYPERTENSION.  The patient has a history of hypertension which is  treated in the outpatient setting with hydrochlorothiazide.  Currently her  Holmes pressure is well controlled and her potassium is balanced.   #5 - STRONG FAMILY HISTORY OF COLON CANCER.  Please see discussion above.  The patient has apparently  had a total of two colonoscopies in her lifetime  with the most recent being in 2000.  This was reported to be normal to me by  the patient.  Further evaluation will be pursued as indicated by pending  workup.                                               Taylor Holmes, M.D.    JTM/MEDQ  D:  06/18/2003  T:  06/18/2003  Job:  [id number]

## 2011-02-09 ENCOUNTER — Other Ambulatory Visit: Payer: Self-pay | Admitting: Family Medicine

## 2011-02-09 DIAGNOSIS — Z139 Encounter for screening, unspecified: Secondary | ICD-10-CM

## 2011-02-17 ENCOUNTER — Ambulatory Visit (HOSPITAL_COMMUNITY)
Admission: RE | Admit: 2011-02-17 | Discharge: 2011-02-17 | Disposition: A | Discharge status: Home / Self Care | Payer: BC Managed Care – PPO | Source: Ambulatory Visit | Attending: Family Medicine | Admitting: Family Medicine

## 2011-02-17 DIAGNOSIS — Z1231 Encounter for screening mammogram for malignant neoplasm of breast: Secondary | ICD-10-CM | POA: Insufficient documentation

## 2011-02-17 DIAGNOSIS — Z139 Encounter for screening, unspecified: Secondary | ICD-10-CM

## 2012-02-22 ENCOUNTER — Other Ambulatory Visit: Payer: Self-pay | Admitting: Family Medicine

## 2012-02-22 DIAGNOSIS — Z139 Encounter for screening, unspecified: Secondary | ICD-10-CM

## 2012-02-29 ENCOUNTER — Ambulatory Visit (HOSPITAL_COMMUNITY)
Admission: RE | Admit: 2012-02-29 | Discharge: 2012-02-29 | Disposition: A | Discharge status: Home / Self Care | Payer: BC Managed Care – PPO | Source: Ambulatory Visit | Attending: Family Medicine | Admitting: Family Medicine

## 2012-02-29 DIAGNOSIS — Z1231 Encounter for screening mammogram for malignant neoplasm of breast: Secondary | ICD-10-CM | POA: Insufficient documentation

## 2012-02-29 DIAGNOSIS — Z139 Encounter for screening, unspecified: Secondary | ICD-10-CM

## 2012-06-28 ENCOUNTER — Other Ambulatory Visit: Payer: Self-pay | Admitting: Family Medicine

## 2012-06-28 DIAGNOSIS — Z780 Asymptomatic menopausal state: Secondary | ICD-10-CM

## 2012-07-12 ENCOUNTER — Other Ambulatory Visit: Payer: BC Managed Care – PPO

## 2013-01-06 ENCOUNTER — Other Ambulatory Visit (HOSPITAL_COMMUNITY): Payer: Self-pay | Admitting: Family Medicine

## 2013-01-06 NOTE — Telephone Encounter (Signed)
Medication refilled per protocol.Patient needs to be seen before any further refills. Left pt mess to call and schedule appt

## 2013-01-08 ENCOUNTER — Other Ambulatory Visit (INDEPENDENT_AMBULATORY_CARE_PROVIDER_SITE_OTHER): Payer: BC Managed Care – PPO

## 2013-01-08 DIAGNOSIS — E785 Hyperlipidemia, unspecified: Secondary | ICD-10-CM

## 2013-01-08 DIAGNOSIS — Z79899 Other long term (current) drug therapy: Secondary | ICD-10-CM

## 2013-01-08 DIAGNOSIS — I10 Essential (primary) hypertension: Secondary | ICD-10-CM

## 2013-01-08 LAB — COMPLETE METABOLIC PANEL WITH GFR
AST: 18 U/L (ref 0–37)
Alkaline Phosphatase: 65 U/L (ref 39–117)
BUN: 12 mg/dL (ref 6–23)
Creat: 0.8 mg/dL (ref 0.50–1.10)
GFR, Est Non African American: 79 mL/min
Glucose, Bld: 90 mg/dL (ref 70–99)
Potassium: 3.8 mEq/L (ref 3.5–5.3)
Total Bilirubin: 0.4 mg/dL (ref 0.3–1.2)

## 2013-01-08 LAB — LIPID PANEL
Cholesterol: 198 mg/dL (ref 0–200)
HDL: 51 mg/dL (ref >39)
Total CHOL/HDL Ratio: 3.9 Ratio
VLDL: 14 mg/dL (ref 0–40)

## 2013-01-10 ENCOUNTER — Encounter: Payer: Self-pay | Admitting: Family Medicine

## 2013-01-10 ENCOUNTER — Ambulatory Visit (INDEPENDENT_AMBULATORY_CARE_PROVIDER_SITE_OTHER): Payer: BC Managed Care – PPO | Admitting: Family Medicine

## 2013-01-10 VITALS — BP 134/88 | HR 64 | Temp 97.9°F | Resp 18 | Ht 61.0 in | Wt 158.0 lb

## 2013-01-10 DIAGNOSIS — E785 Hyperlipidemia, unspecified: Secondary | ICD-10-CM

## 2013-01-10 DIAGNOSIS — I10 Essential (primary) hypertension: Secondary | ICD-10-CM | POA: Insufficient documentation

## 2013-01-10 DIAGNOSIS — T7840XA Allergy, unspecified, initial encounter: Secondary | ICD-10-CM | POA: Insufficient documentation

## 2013-01-10 NOTE — Progress Notes (Signed)
Subjective:    Patient ID: Taylor Holmes, female    DOB: 1951/07/04, 62 y.o.   MRN: [id number]  HPI  Patient for followup of her hypertension and her hyperlipidemia. She is currently taking hydrochlorothiazide 25 mg by mouth daily. She is also taking simvastatin 40 mg by mouth daily. She denies any chest pain, shortness of breath, dyspnea on exertion. She denies any myalgias or right upper quadrant pain. Her recent labwork as listed below. Her LDL is 3 points above her goal of 130. The other lab work is completely normal. Lab on 01/08/2013  Component Date Value Range Status  . Cholesterol 01/08/2013 198  0 - 200 mg/dL Final   Comment: ATP III Classification:                                < 200        mg/dL        Desirable                               200 - 239     mg/dL        Borderline High                               >= 240        mg/dL        High                             . Triglycerides 01/08/2013 72  <150 mg/dL Final  . HDL 44/08/270 51  >39 mg/dL Final  . Total CHOL/HDL Ratio 01/08/2013 3.9   Final  . VLDL 01/08/2013 14  0 - 40 mg/dL Final  . LDL Cholesterol 01/08/2013 133* 0 - 99 mg/dL Final   Comment:                            Total Cholesterol/HDL Ratio:CHD Risk                                                 Coronary Heart Disease Risk Table                                                                 Men       Women                                   1/2 Average Risk              3.4        3.3                                       Average  Risk              5.0        4.4                                    2X Average Risk              9.6        7.1                                    3X Average Risk             23.4       11.0                          Use the calculated Patient Ratio above and the CHD Risk table                           to determine the patient's CHD Risk.                          ATP III Classification (LDL):   < 100        mg/dL         Optimal                               100 - 129     mg/dL         Near or Above Optimal                               130 - 159     mg/dL         Borderline High                               160 - 189     mg/dL         High                                > 190        mg/dL         Very High                             . Sodium 01/08/2013 145  135 - 145 mEq/L Final  . Potassium 01/08/2013 3.8  3.5 - 5.3 mEq/L Final  . Chloride 01/08/2013 104  96 - 112 mEq/L Final  . CO2 01/08/2013 30  19 - 32 mEq/L Final  . Glucose, Bld 01/08/2013 90  70 - 99 mg/dL Final  . BUN 16/05/9603 12  6 - 23 mg/dL Final  . Creat 54/04/8118 0.80  0.50 - 1.10 mg/dL Final  . Total Bilirubin 01/08/2013 0.4  0.3 - 1.2 mg/dL Final  . Alkaline Phosphatase 01/08/2013 65  39 - 117 U/L Final  . AST 01/08/2013 18  0 - 37 U/L Final  . ALT 01/08/2013 17  0 - 35 U/L Final  . Total Protein 01/08/2013 6.8  6.0 - 8.3 g/dL Final  . Albumin 21/30/8657 4.1  3.5 - 5.2 g/dL Final  . Calcium 84/69/6295 8.7  8.4 - 10.5 mg/dL Final  . GFR, Est African American 01/08/2013 >89   Final  . GFR, Est Non African American 01/08/2013 79   Final   Comment:                            The estimated GFR is a calculation valid for adults (>=48 years old)                          that uses the CKD-EPI algorithm to adjust for age and sex. It is                            not to be used for children, pregnant women, hospitalized patients,                             patients on dialysis, or with rapidly changing kidney function.                          According to the NKDEP, eGFR >89 is normal, 60-89 shows mild                          impairment, 30-59 shows moderate impairment, 15-29 shows severe                          impairment and <15 is ESRD.                              Past Medical History  Diagnosis Date  . Hypertension   . Hyperlipidemia   . Allergy    Current Outpatient Prescriptions on File Prior to  Visit  Medication Sig Dispense Refill  . simvastatin (ZOCOR) 40 MG tablet TAKE 1 TABLET BY MOUTH AT BEDTIME  30 tablet  0   No current facility-administered medications on file prior to visit.   No Known Allergies History   Social History  . Marital Status: Married    Spouse Name: N/A    Number of Children: N/A  . Years of Education: N/A   Occupational History  . Not on file.   Social History Main Topics  . Smoking status: Never Smoker   . Smokeless tobacco: Never Used  . Alcohol Use: No  . Drug Use: No  . Sexually Active: Not on file   Other Topics Concern  . Not on file   Social History Narrative  . No narrative on file      Review of Systems  All other systems reviewed and are negative.       Objective:   Physical Exam  Vitals reviewed. Constitutional: She appears well-developed and well-nourished.  Neck: No thyromegaly present.  Cardiovascular: Normal rate, regular rhythm and normal heart sounds.   No murmur heard. Pulmonary/Chest: Effort normal and breath sounds normal. No respiratory distress. She has no wheezes. She has no rales.  Abdominal: Soft. Bowel sounds are normal. She exhibits no distension. There is no tenderness. There is no  rebound.  Lymphadenopathy:    She has no cervical adenopathy.          Assessment & Plan:  1. HTN (hypertension) Blood pressure is borderline. I recommend the patient begin aerobic exercise for a total of 30 minutes a day, 5 days a week. Recheck blood pressure in 6 months.  2. HLD (hyperlipidemia) I recommended she increase her aerobic exercise as discussed in problem #1. Like to see her lose 5-10 pounds. I believe this will get her LDL below 1:30. We also discussed a low saturated fat diet. Continue her present medications at their current dosages. She is due for a bone density test in 2 years. She is due for mammogram this summer and she will schedule that.

## 2013-02-18 ENCOUNTER — Other Ambulatory Visit: Payer: Self-pay | Admitting: Family Medicine

## 2013-03-06 ENCOUNTER — Other Ambulatory Visit (HOSPITAL_COMMUNITY): Payer: Self-pay | Admitting: Family Medicine

## 2013-03-06 NOTE — Telephone Encounter (Signed)
Med refilled.

## 2013-03-17 ENCOUNTER — Other Ambulatory Visit: Payer: Self-pay | Admitting: Family Medicine

## 2013-03-17 DIAGNOSIS — Z139 Encounter for screening, unspecified: Secondary | ICD-10-CM

## 2013-03-18 ENCOUNTER — Ambulatory Visit (HOSPITAL_COMMUNITY): Payer: BC Managed Care – PPO

## 2013-03-21 ENCOUNTER — Ambulatory Visit (HOSPITAL_COMMUNITY): Payer: BC Managed Care – PPO

## 2013-03-25 ENCOUNTER — Ambulatory Visit (HOSPITAL_COMMUNITY): Payer: BC Managed Care – PPO

## 2013-04-02 ENCOUNTER — Encounter: Payer: Self-pay | Admitting: *Deleted

## 2013-04-16 ENCOUNTER — Other Ambulatory Visit: Payer: Self-pay

## 2013-04-16 DIAGNOSIS — Z1231 Encounter for screening mammogram for malignant neoplasm of breast: Secondary | ICD-10-CM

## 2013-04-18 ENCOUNTER — Other Ambulatory Visit: Payer: Self-pay | Admitting: Family Medicine

## 2013-05-09 ENCOUNTER — Ambulatory Visit: Payer: BC Managed Care – PPO

## 2013-05-09 ENCOUNTER — Ambulatory Visit
Admission: RE | Admit: 2013-05-09 | Discharge: 2013-05-09 | Disposition: A | Discharge status: Home / Self Care | Payer: BC Managed Care – PPO | Source: Ambulatory Visit

## 2013-05-09 DIAGNOSIS — Z1231 Encounter for screening mammogram for malignant neoplasm of breast: Secondary | ICD-10-CM

## 2013-05-12 ENCOUNTER — Other Ambulatory Visit: Payer: Self-pay | Admitting: Family Medicine

## 2013-05-12 MED ORDER — CITALOPRAM HYDROBROMIDE 20 MG PO TABS: ORAL_TABLET | ORAL | Status: DC

## 2013-05-12 NOTE — Telephone Encounter (Signed)
Rx Refilled  

## 2013-05-13 ENCOUNTER — Other Ambulatory Visit: Payer: Self-pay | Admitting: Family Medicine

## 2013-05-13 DIAGNOSIS — R928 Other abnormal and inconclusive findings on diagnostic imaging of breast: Secondary | ICD-10-CM

## 2013-05-27 ENCOUNTER — Ambulatory Visit
Admission: RE | Admit: 2013-05-27 | Discharge: 2013-05-27 | Disposition: A | Discharge status: Home / Self Care | Payer: BC Managed Care – PPO | Source: Ambulatory Visit | Attending: Family Medicine | Admitting: Family Medicine

## 2013-05-27 DIAGNOSIS — R928 Other abnormal and inconclusive findings on diagnostic imaging of breast: Secondary | ICD-10-CM

## 2013-07-21 ENCOUNTER — Other Ambulatory Visit (HOSPITAL_COMMUNITY): Payer: Self-pay | Admitting: Family Medicine

## 2013-08-21 ENCOUNTER — Encounter: Payer: Self-pay | Admitting: Family Medicine

## 2013-08-21 ENCOUNTER — Ambulatory Visit (INDEPENDENT_AMBULATORY_CARE_PROVIDER_SITE_OTHER): Payer: BC Managed Care – PPO | Admitting: Family Medicine

## 2013-08-21 VITALS — BP 120/74 | HR 72 | Temp 97.3°F | Resp 16 | Ht 62.0 in | Wt 160.0 lb

## 2013-08-21 DIAGNOSIS — J209 Acute bronchitis, unspecified: Secondary | ICD-10-CM

## 2013-08-21 MED ORDER — AZITHROMYCIN 250 MG PO TABS: ORAL_TABLET | ORAL | Status: DC

## 2013-08-21 MED ORDER — HYDROCODONE-HOMATROPINE 5-1.5 MG/5ML PO SYRP: 5.0000 mL | ORAL_SOLUTION | Freq: Four times a day (QID) | ORAL | Status: DC | PRN

## 2013-08-21 NOTE — Progress Notes (Signed)
Subjective:    Patient ID: Taylor Holmes, female    DOB: 12-15-1950, 63 y.o.   MRN: [id number]  HPI Patient presents with a five-day history of cough productive of yellow sputum. She denies any shortness of breath. She denies any fevers. She does report fatigue. She denies any myalgias. She denies any hemoptysis. She denies any rhinorrhea. She denies any sinus pain. She denies a sore throat. She denies any otalgia. Past Medical History  Diagnosis Date  . Hypertension   . Hyperlipidemia   . Allergy    Current Outpatient Prescriptions on File Prior to Visit  Medication Sig Dispense Refill  . cetirizine (ZYRTEC) 10 MG tablet Take 10 mg by mouth daily as needed for allergies.      . citalopram (CELEXA) 20 MG tablet TAKE ONE TABLET BY MOUTH EVERY DAY  90 tablet  1  . hydrochlorothiazide (HYDRODIURIL) 25 MG tablet TAKE 1 TABLET EVERY DAY  30 tablet  5  . Omega-3 Fatty Acids (FISH OIL) 1200 MG CAPS Take 1 capsule by mouth 2 (two) times daily.      . simvastatin (ZOCOR) 40 MG tablet TAKE 1 TABLET BY MOUTH AT BEDTIME  30 tablet  3  . vitamin E 400 UNIT capsule Take 400 Units by mouth daily.       No current facility-administered medications on file prior to visit.   No Known Allergies History   Social History  . Marital Status: Married    Spouse Name: N/A    Number of Children: N/A  . Years of Education: N/A   Occupational History  . Not on file.   Social History Main Topics  . Smoking status: Never Smoker   . Smokeless tobacco: Never Used  . Alcohol Use: No  . Drug Use: No  . Sexual Activity: Not on file   Other Topics Concern  . Not on file   Social History Narrative  . No narrative on file      Review of Systems  All other systems reviewed and are negative.       Objective:   Physical Exam  Vitals reviewed. Constitutional: She appears well-developed and well-nourished. No distress.  HENT:  Right Ear: External ear normal.  Left Ear: External ear normal.    Nose: Nose normal.  Mouth/Throat: Oropharynx is clear and moist. No oropharyngeal exudate.  Eyes: Conjunctivae are normal. Pupils are equal, round, and reactive to light. No scleral icterus.  Neck: Neck supple.  Cardiovascular: Normal rate, regular rhythm and normal heart sounds.   No murmur heard. Pulmonary/Chest: Effort normal and breath sounds normal. No respiratory distress. She has no wheezes. She has no rales.  Abdominal: Soft. Bowel sounds are normal.  Lymphadenopathy:    She has no cervical adenopathy.  Skin: She is not diaphoretic.          Assessment & Plan:  1. Acute bronchitis I explained to the patient I believe she has viral bronchitis.  I recommended tincture of time, Mucinex 400 mg every 4 hours and Hycodan 1 teaspoon every 6 hours as needed for cough. If her symptoms worsen or if she develops a high fever or cough productive of purulent sputum I want her to fill the prescription for Z-Pak.  I explained that antibiotics do not treat bile infections. The patient understands and will follow the directions. - azithromycin (ZITHROMAX) 250 MG tablet; 2 atbs poqday 1, 1 tab poqday 2-5  Dispense: 6 tablet; Refill: 0 - HYDROcodone-homatropine (HYCODAN) 5-1.5 MG/5ML syrup;  Take 5 mLs by mouth every 6 (six) hours as needed for cough.  Dispense: 120 mL; Refill: 0

## 2013-11-01 ENCOUNTER — Other Ambulatory Visit: Payer: Self-pay | Admitting: Family Medicine

## 2013-11-27 ENCOUNTER — Other Ambulatory Visit (HOSPITAL_COMMUNITY): Payer: Self-pay | Admitting: Family Medicine

## 2013-12-18 ENCOUNTER — Other Ambulatory Visit (HOSPITAL_COMMUNITY): Payer: Self-pay | Admitting: Family Medicine

## 2014-04-02 ENCOUNTER — Other Ambulatory Visit (HOSPITAL_COMMUNITY): Payer: Self-pay | Admitting: Family Medicine

## 2014-05-14 ENCOUNTER — Other Ambulatory Visit: Payer: Self-pay | Admitting: Family Medicine

## 2014-06-04 ENCOUNTER — Other Ambulatory Visit: Payer: BC Managed Care – PPO

## 2014-06-04 DIAGNOSIS — I10 Essential (primary) hypertension: Secondary | ICD-10-CM

## 2014-06-04 DIAGNOSIS — E785 Hyperlipidemia, unspecified: Secondary | ICD-10-CM

## 2014-06-04 DIAGNOSIS — Z0000 Encounter for general adult medical examination without abnormal findings: Secondary | ICD-10-CM

## 2014-06-04 LAB — COMPLETE METABOLIC PANEL WITH GFR
ALBUMIN: 4 g/dL (ref 3.5–5.2)
ALK PHOS: 70 U/L (ref 39–117)
ALT: 18 U/L (ref 0–35)
AST: 17 U/L (ref 0–37)
BUN: 17 mg/dL (ref 6–23)
CHLORIDE: 107 meq/L (ref 96–112)
CO2: 26 mEq/L (ref 19–32)
Calcium: 8.8 mg/dL (ref 8.4–10.5)
Creat: 0.78 mg/dL (ref 0.50–1.10)
GFR, Est African American: >89 mL/min
GFR, Est Non African American: 81 mL/min
Glucose, Bld: 89 mg/dL (ref 70–99)
POTASSIUM: 3.6 meq/L (ref 3.5–5.3)
SODIUM: 142 meq/L (ref 135–145)
TOTAL PROTEIN: 6.8 g/dL (ref 6.0–8.3)
Total Bilirubin: 0.5 mg/dL (ref 0.2–1.2)

## 2014-06-04 LAB — CBC WITH DIFFERENTIAL/PLATELET
BASOS ABS: 0 10*3/uL (ref 0.0–0.1)
Basophils Relative: 0 % (ref 0–1)
EOS ABS: 0.2 10*3/uL (ref 0.0–0.7)
Eosinophils Relative: 2 % (ref 0–5)
HEMATOCRIT: 39.2 % (ref 36.0–46.0)
Hemoglobin: 12.9 g/dL (ref 12.0–15.0)
LYMPHS PCT: 38 % (ref 12–46)
Lymphs Abs: 2.9 10*3/uL (ref 0.7–4.0)
MCH: 29 pg (ref 26.0–34.0)
MCHC: 32.9 g/dL (ref 30.0–36.0)
MCV: 88.1 fL (ref 78.0–100.0)
MONO ABS: 0.5 10*3/uL (ref 0.1–1.0)
Monocytes Relative: 7 % (ref 3–12)
Neutro Abs: 4 10*3/uL (ref 1.7–7.7)
Neutrophils Relative %: 53 % (ref 43–77)
Platelets: 223 10*3/uL (ref 150–400)
RBC: 4.45 MIL/uL (ref 3.87–5.11)
RDW: 14.5 % (ref 11.5–15.5)
WBC: 7.5 10*3/uL (ref 4.0–10.5)

## 2014-06-04 LAB — LIPID PANEL
CHOLESTEROL: 181 mg/dL (ref 0–200)
HDL: 47 mg/dL (ref >39)
LDL CALC: 117 mg/dL — AB (ref 0–99)
Total CHOL/HDL Ratio: 3.9 Ratio
Triglycerides: 83 mg/dL (ref <150)
VLDL: 17 mg/dL (ref 0–40)

## 2014-06-04 LAB — TSH: TSH: 2.52 u[IU]/mL (ref 0.350–4.500)

## 2014-06-09 ENCOUNTER — Ambulatory Visit (INDEPENDENT_AMBULATORY_CARE_PROVIDER_SITE_OTHER): Payer: BC Managed Care – PPO | Admitting: Family Medicine

## 2014-06-09 ENCOUNTER — Encounter: Payer: Self-pay | Admitting: Family Medicine

## 2014-06-09 VITALS — BP 132/88 | HR 74 | Temp 98.0°F | Resp 16 | Ht 62.0 in | Wt 166.0 lb

## 2014-06-09 DIAGNOSIS — Z0000 Encounter for general adult medical examination without abnormal findings: Secondary | ICD-10-CM

## 2014-06-09 MED ORDER — ZOSTER VACCINE LIVE 19400 UNT/0.65ML ~~LOC~~ SOLR: 0.6500 mL | Freq: Once | SUBCUTANEOUS | Status: DC

## 2014-06-09 MED ORDER — CITALOPRAM HYDROBROMIDE 20 MG PO TABS: ORAL_TABLET | ORAL | Status: DC

## 2014-06-09 NOTE — Addendum Note (Signed)
Addended by: Shary Decamp B on: 06/09/2014 03:38 PM   Modules accepted: Orders, Medications

## 2014-06-09 NOTE — Progress Notes (Signed)
Subjective:    Patient ID: Taylor Holmes, female    DOB: 1950-10-19, 63 y.o.   MRN: [id number]  HPI Patient is a very pleasant 63 year old African-American female who presents today for complete physical exam. She is due for a mammogram as well as a bone density test. Her colonoscopy was performed in 2012. Tubular adenomas were discovered. She is due for colonoscopy in 2017. She has a history of a hysterectomy and therefore does not require Pap smears. Her flu shot is up-to-date. Her tetanus shot is up-to-date. We discussed the shingles vaccine today. Her most recent lab work as listed below: Lab on 06/04/2014  Component Date Value Ref Range Status  . Sodium 06/04/2014 142  135 - 145 mEq/L Final  . Potassium 06/04/2014 3.6  3.5 - 5.3 mEq/L Final  . Chloride 06/04/2014 107  96 - 112 mEq/L Final  . CO2 06/04/2014 26  19 - 32 mEq/L Final  . Glucose, Bld 06/04/2014 89  70 - 99 mg/dL Final  . BUN 06/04/2014 17  6 - 23 mg/dL Final  . Creat 06/04/2014 0.78  0.50 - 1.10 mg/dL Final  . Total Bilirubin 06/04/2014 0.5  0.2 - 1.2 mg/dL Final  . Alkaline Phosphatase 06/04/2014 70  39 - 117 U/L Final  . AST 06/04/2014 17  0 - 37 U/L Final  . ALT 06/04/2014 18  0 - 35 U/L Final  . Total Protein 06/04/2014 6.8  6.0 - 8.3 g/dL Final  . Albumin 06/04/2014 4.0  3.5 - 5.2 g/dL Final  . Calcium 06/04/2014 8.8  8.4 - 10.5 mg/dL Final  . GFR, Est African American 06/04/2014 >89   Final  . GFR, Est Non African American 06/04/2014 81   Final   Comment:                            The estimated GFR is a calculation valid for adults (>=53 years old)                          that uses the CKD-EPI algorithm to adjust for age and sex. It is                            not to be used for children, pregnant women, hospitalized patients,                             patients on dialysis, or with rapidly changing kidney function.                          According to the NKDEP, eGFR >89 is normal, 60-89 shows mild                           impairment, 30-59 shows moderate impairment, 15-29 shows severe                          impairment and <15 is ESRD.                             . WBC 06/04/2014 7.5  4.0 - 10.5 K/uL Final  . RBC 06/04/2014 4.45  3.87 - 5.11 MIL/uL  Final  . Hemoglobin 06/04/2014 12.9  12.0 - 15.0 g/dL Final  . HCT 06/04/2014 39.2  36.0 - 46.0 % Final  . MCV 06/04/2014 88.1  78.0 - 100.0 fL Final  . MCH 06/04/2014 29.0  26.0 - 34.0 pg Final  . MCHC 06/04/2014 32.9  30.0 - 36.0 g/dL Final  . RDW 06/04/2014 14.5  11.5 - 15.5 % Final  . Platelets 06/04/2014 223  150 - 400 K/uL Final  . Neutrophils Relative % 06/04/2014 53  43 - 77 % Final  . Neutro Abs 06/04/2014 4.0  1.7 - 7.7 K/uL Final  . Lymphocytes Relative 06/04/2014 38  12 - 46 % Final  . Lymphs Abs 06/04/2014 2.9  0.7 - 4.0 K/uL Final  . Monocytes Relative 06/04/2014 7  3 - 12 % Final  . Monocytes Absolute 06/04/2014 0.5  0.1 - 1.0 K/uL Final  . Eosinophils Relative 06/04/2014 2  0 - 5 % Final  . Eosinophils Absolute 06/04/2014 0.2  0.0 - 0.7 K/uL Final  . Basophils Relative 06/04/2014 0  0 - 1 % Final  . Basophils Absolute 06/04/2014 0.0  0.0 - 0.1 K/uL Final  . Smear Review 06/04/2014 Criteria for review not met   Final  . TSH 06/04/2014 2.520  0.350 - 4.500 uIU/mL Final  . Cholesterol 06/04/2014 181  0 - 200 mg/dL Final   Comment: ATP III Classification:                                < 200        mg/dL        Desirable                               200 - 239     mg/dL        Borderline High                               >= 240        mg/dL        High                             . Triglycerides 06/04/2014 83  <150 mg/dL Final  . HDL 06/04/2014 47  >39 mg/dL Final  . Total CHOL/HDL Ratio 06/04/2014 3.9   Final  . VLDL 06/04/2014 17  0 - 40 mg/dL Final  . LDL Cholesterol 06/04/2014 117* 0 - 99 mg/dL Final   Comment:                            Total Cholesterol/HDL Ratio:CHD Risk                                                  Coronary Heart Disease Risk Table  Men       Women                                   1/2 Average Risk              3.4        3.3                                       Average Risk              5.0        4.4                                    2X Average Risk              9.6        7.1                                    3X Average Risk             23.4       11.0                          Use the calculated Patient Ratio above and the CHD Risk table                           to determine the patient's CHD Risk.                          ATP III Classification (LDL):                                < 100        mg/dL         Optimal                               100 - 129     mg/dL         Near or Above Optimal                               130 - 159     mg/dL         Borderline High                               160 - 189     mg/dL         High                                > 190        mg/dL         Very High  Past Medical History  Diagnosis Date  . Hypertension   . Hyperlipidemia   . Allergy    No past surgical history on file. Current Outpatient Prescriptions on File Prior to Visit  Medication Sig Dispense Refill  . cetirizine (ZYRTEC) 10 MG tablet Take 10 mg by mouth daily as needed for allergies.    . hydrochlorothiazide (HYDRODIURIL) 25 MG tablet TAKE 1 TABLET EVERY DAY 30 tablet 5  . simvastatin (ZOCOR) 40 MG tablet TAKE 1 TABLET BY MOUTH AT BEDTIME 30 tablet 3  . vitamin E 400 UNIT capsule Take 400 Units by mouth daily.     No current facility-administered medications on file prior to visit.   No Known Allergies History   Social History  . Marital Status: Married    Spouse Name: N/A    Number of Children: N/A  . Years of Education: N/A   Occupational History  . Not on file.   Social History Main Topics  . Smoking status: Never Smoker   . Smokeless tobacco: Never Used    . Alcohol Use: No  . Drug Use: No  . Sexual Activity: Yes   Other Topics Concern  . Not on file   Social History Narrative    family history is significant for mother with dementia, a father with benign prostatic hypertrophy, a mother with seizure disorder.   Review of Systems  All other systems reviewed and are negative.      Objective:   Physical Exam  Constitutional: She is oriented to person, place, and time. She appears well-developed and well-nourished. No distress.  HENT:  Head: Normocephalic and atraumatic.  Right Ear: External ear normal.  Left Ear: External ear normal.  Nose: Nose normal.  Mouth/Throat: Oropharynx is clear and moist. No oropharyngeal exudate.  Eyes: Conjunctivae and EOM are normal. Pupils are equal, round, and reactive to light. Right eye exhibits no discharge. Left eye exhibits no discharge. No scleral icterus.  Neck: Normal range of motion. Neck supple. No JVD present. No tracheal deviation present. No thyromegaly present.  Cardiovascular: Normal rate, regular rhythm, normal heart sounds and intact distal pulses.  Exam reveals no gallop and no friction rub.   No murmur heard. Pulmonary/Chest: Effort normal and breath sounds normal. No stridor. No respiratory distress. She has no wheezes. She has no rales. She exhibits no tenderness.  Abdominal: Soft. Bowel sounds are normal. She exhibits no distension and no mass. There is no tenderness. There is no rebound and no guarding.  Musculoskeletal: Normal range of motion. She exhibits no edema or tenderness.  Lymphadenopathy:    She has no cervical adenopathy.  Neurological: She is alert and oriented to person, place, and time. She has normal reflexes. She displays normal reflexes. No cranial nerve deficit. She exhibits normal muscle tone. Coordination normal.  Skin: Skin is warm. No rash noted. She is not diaphoretic. No erythema. No pallor.  Psychiatric: She has a normal mood and affect. Her behavior is  normal. Judgment and thought content normal.  Vitals reviewed.         Assessment & Plan:  Routine general medical examination at a health care facility  Patient's physical exam is completely normal. Her blood pressure is excellent. Her labwork is normal. Her immunizations are up-to-date. I did send a prescription for the shingles vaccine to her local pharmacy. I will schedule the patient for a mammogram as well as a bone density test. She is not due for a colonoscopy for 2 more years. Her flu  shot was given to the patient at work.

## 2014-07-16 ENCOUNTER — Ambulatory Visit (HOSPITAL_COMMUNITY): Payer: BC Managed Care – PPO

## 2014-07-16 ENCOUNTER — Other Ambulatory Visit: Payer: Self-pay | Admitting: Family Medicine

## 2014-07-16 DIAGNOSIS — Z1231 Encounter for screening mammogram for malignant neoplasm of breast: Secondary | ICD-10-CM

## 2014-07-20 ENCOUNTER — Ambulatory Visit (HOSPITAL_COMMUNITY)
Admission: RE | Admit: 2014-07-20 | Discharge: 2014-07-20 | Disposition: A | Discharge status: Home / Self Care | Payer: BC Managed Care – PPO | Source: Ambulatory Visit | Attending: Family Medicine | Admitting: Family Medicine

## 2014-07-20 DIAGNOSIS — Z1231 Encounter for screening mammogram for malignant neoplasm of breast: Secondary | ICD-10-CM | POA: Insufficient documentation

## 2014-07-20 DIAGNOSIS — Z0000 Encounter for general adult medical examination without abnormal findings: Secondary | ICD-10-CM

## 2014-08-31 ENCOUNTER — Other Ambulatory Visit: Payer: Self-pay | Admitting: Family Medicine

## 2014-08-31 NOTE — Telephone Encounter (Signed)
Medication refilled per protocol. 

## 2014-10-09 ENCOUNTER — Other Ambulatory Visit: Payer: Self-pay | Admitting: Family Medicine

## 2014-12-31 ENCOUNTER — Other Ambulatory Visit: Payer: Self-pay | Admitting: *Deleted

## 2014-12-31 DIAGNOSIS — Z0184 Encounter for antibody response examination: Secondary | ICD-10-CM

## 2015-01-05 ENCOUNTER — Other Ambulatory Visit: Payer: BC Managed Care – PPO

## 2015-01-05 DIAGNOSIS — Z0184 Encounter for antibody response examination: Secondary | ICD-10-CM

## 2015-01-06 LAB — HEPATITIS B SURFACE ANTIBODY, QUANTITATIVE: Hepatitis B-Post: 818 m[IU]/mL

## 2015-01-06 LAB — VARICELLA ZOSTER ANTIBODY, IGG: Varicella IgG: 624.8 Index — ABNORMAL HIGH (ref <135.00)

## 2015-01-08 ENCOUNTER — Encounter: Payer: Self-pay | Admitting: *Deleted

## 2015-02-01 ENCOUNTER — Other Ambulatory Visit: Payer: Self-pay

## 2015-03-23 ENCOUNTER — Other Ambulatory Visit: Payer: Self-pay | Admitting: Family Medicine

## 2015-03-23 NOTE — Telephone Encounter (Signed)
Refill appropriate and filled per protocol. 

## 2015-05-16 ENCOUNTER — Other Ambulatory Visit: Payer: Self-pay | Admitting: Family Medicine

## 2015-05-17 NOTE — Telephone Encounter (Signed)
Refill appropriate and filled per protocol. 

## 2015-06-16 ENCOUNTER — Other Ambulatory Visit: Payer: Self-pay | Admitting: Family Medicine

## 2015-06-16 ENCOUNTER — Encounter: Payer: Self-pay | Admitting: Family Medicine

## 2015-06-16 NOTE — Telephone Encounter (Signed)
Medication refill for one time only.  Patient needs to be seen.  Letter sent for patient to call and schedule 

## 2015-06-22 ENCOUNTER — Ambulatory Visit: Payer: BC Managed Care – PPO | Admitting: Family Medicine

## 2015-07-06 ENCOUNTER — Other Ambulatory Visit: Payer: Self-pay | Admitting: Family Medicine

## 2015-07-06 DIAGNOSIS — Z1231 Encounter for screening mammogram for malignant neoplasm of breast: Secondary | ICD-10-CM

## 2015-07-12 ENCOUNTER — Other Ambulatory Visit: Payer: Self-pay | Admitting: Family Medicine

## 2015-07-12 NOTE — Telephone Encounter (Signed)
Refill appropriate and filled per protocol. 

## 2015-07-16 ENCOUNTER — Ambulatory Visit (INDEPENDENT_AMBULATORY_CARE_PROVIDER_SITE_OTHER): Payer: Medicare Other | Admitting: Family Medicine

## 2015-07-16 ENCOUNTER — Encounter: Payer: Self-pay | Admitting: Family Medicine

## 2015-07-16 ENCOUNTER — Other Ambulatory Visit: Payer: Self-pay | Admitting: Family Medicine

## 2015-07-16 VITALS — BP 128/70 | HR 68 | Temp 98.3°F | Resp 14 | Ht 62.0 in | Wt 166.0 lb

## 2015-07-16 DIAGNOSIS — Z0000 Encounter for general adult medical examination without abnormal findings: Secondary | ICD-10-CM

## 2015-07-16 DIAGNOSIS — K219 Gastro-esophageal reflux disease without esophagitis: Secondary | ICD-10-CM

## 2015-07-16 LAB — CBC WITH DIFFERENTIAL/PLATELET
BASOS ABS: 0 10*3/uL (ref 0.0–0.1)
Basophils Relative: 0 % (ref 0–1)
EOS PCT: 2 % (ref 0–5)
Eosinophils Absolute: 0.2 10*3/uL (ref 0.0–0.7)
HEMATOCRIT: 36.2 % (ref 36.0–46.0)
Hemoglobin: 12.1 g/dL (ref 12.0–15.0)
Lymphocytes Relative: 35 % (ref 12–46)
Lymphs Abs: 2.8 10*3/uL (ref 0.7–4.0)
MCH: 30 pg (ref 26.0–34.0)
MCHC: 33.4 g/dL (ref 30.0–36.0)
MCV: 89.6 fL (ref 78.0–100.0)
MPV: 9.3 fL (ref 8.6–12.4)
Monocytes Absolute: 0.5 10*3/uL (ref 0.1–1.0)
Monocytes Relative: 6 % (ref 3–12)
Neutro Abs: 4.6 10*3/uL (ref 1.7–7.7)
Neutrophils Relative %: 57 % (ref 43–77)
PLATELETS: 232 10*3/uL (ref 150–400)
RBC: 4.04 MIL/uL (ref 3.87–5.11)
RDW: 14.5 % (ref 11.5–15.5)
WBC: 8 10*3/uL (ref 4.0–10.5)

## 2015-07-16 LAB — COMPLETE METABOLIC PANEL WITH GFR
ALT: 19 U/L (ref 6–29)
AST: 19 U/L (ref 10–35)
Albumin: 3.7 g/dL (ref 3.6–5.1)
Alkaline Phosphatase: 61 U/L (ref 33–130)
BUN: 13 mg/dL (ref 7–25)
CALCIUM: 8.6 mg/dL (ref 8.6–10.4)
CHLORIDE: 106 mmol/L (ref 98–110)
CO2: 26 mmol/L (ref 20–31)
CREATININE: 0.71 mg/dL (ref 0.50–0.99)
GFR, Est African American: >89 mL/min (ref >=60)
GFR, Est Non African American: >89 mL/min (ref >=60)
Glucose, Bld: 77 mg/dL (ref 70–99)
Potassium: 3.9 mmol/L (ref 3.5–5.3)
Sodium: 143 mmol/L (ref 135–146)
TOTAL PROTEIN: 6.6 g/dL (ref 6.1–8.1)
Total Bilirubin: 0.3 mg/dL (ref 0.2–1.2)

## 2015-07-16 LAB — LIPID PANEL
Cholesterol: 161 mg/dL (ref 125–200)
HDL: 50 mg/dL (ref >=46)
LDL CALC: 91 mg/dL (ref <130)
Total CHOL/HDL Ratio: 3.2 Ratio (ref <=5.0)
Triglycerides: 98 mg/dL (ref <150)
VLDL: 20 mg/dL (ref <30)

## 2015-07-16 MED ORDER — OMEPRAZOLE 40 MG PO CPDR: 40.0000 mg | DELAYED_RELEASE_CAPSULE | Freq: Every day | ORAL | Status: DC

## 2015-07-16 MED ORDER — CITALOPRAM HYDROBROMIDE 20 MG PO TABS: 20.0000 mg | ORAL_TABLET | Freq: Every day | ORAL | Status: DC

## 2015-07-16 NOTE — Progress Notes (Signed)
Subjective:    Patient ID: Taylor Holmes, female    DOB: 1951-01-30, 64 y.o.   MRN: [id number]  HPI Patient is a very pleasant 64 year old African-American female who is here today for complete physical exam. She is due for Pneumovax 23. Her flu shot and tetanus shot are up-to-date. She is due for the shingles vaccine and we discussed this again this year. I will give her Prevnar 13 neck year. Patient has rescheduled her mammogram. She is due for a bone density test. Her last colonoscopy was in 2012. It was significant for a tubular adenoma. She is due again in 2017. She has a history of a hysterectomy and therefore does not require a Pap smear. She reports acid reflux recently over the last few months. This is been unrelieved by over-the-counter Zantac. She denies any melena or bright red blood per rectum. She denies any angina. She is also been having some tendinitis in the biceps tendon on the left arm. Past Medical History  Diagnosis Date  . Hypertension   . Hyperlipidemia   . Allergy    No past surgical history on file. Current Outpatient Prescriptions on File Prior to Visit  Medication Sig Dispense Refill  . cetirizine (ZYRTEC) 10 MG tablet Take 10 mg by mouth daily as needed for allergies.    . hydrochlorothiazide (HYDRODIURIL) 25 MG tablet TAKE 1 TABLET EVERY DAY 30 tablet 0  . Melatonin 5 MG TABS Take 5 mg by mouth.    . simvastatin (ZOCOR) 40 MG tablet TAKE 1 TABLET BY MOUTH AT BEDTIME 30 tablet 3  . vitamin B-12 (CYANOCOBALAMIN) 1000 MCG tablet Take 1,000 mcg by mouth daily.    . vitamin E 400 UNIT capsule Take 400 Units by mouth daily.     No current facility-administered medications on file prior to visit.   No Known Allergies Social History   Social History  . Marital Status: Married    Spouse Name: N/A  . Number of Children: N/A  . Years of Education: N/A   Occupational History  . Not on file.   Social History Main Topics  . Smoking status: Never Smoker   .  Smokeless tobacco: Never Used  . Alcohol Use: No  . Drug Use: No  . Sexual Activity: Yes   Other Topics Concern  . Not on file   Social History Narrative   No family history on file.    Review of Systems  All other systems reviewed and are negative.      Objective:   Physical Exam  Constitutional: She is oriented to person, place, and time. She appears well-developed and well-nourished. No distress.  HENT:  Head: Normocephalic and atraumatic.  Right Ear: External ear normal.  Left Ear: External ear normal.  Nose: Nose normal.  Mouth/Throat: Oropharynx is clear and moist. No oropharyngeal exudate.  Eyes: Conjunctivae and EOM are normal. Pupils are equal, round, and reactive to light. Right eye exhibits no discharge. Left eye exhibits no discharge. No scleral icterus.  Neck: Normal range of motion. Neck supple. No JVD present. No tracheal deviation present. No thyromegaly present.  Cardiovascular: Normal rate, regular rhythm, normal heart sounds and intact distal pulses.  Exam reveals no gallop and no friction rub.   No murmur heard. Pulmonary/Chest: Effort normal and breath sounds normal. No stridor. No respiratory distress. She has no wheezes. She has no rales. She exhibits no tenderness.  Abdominal: Soft. Bowel sounds are normal. She exhibits no distension and no mass. There  is no tenderness. There is no rebound and no guarding.  Musculoskeletal: Normal range of motion. She exhibits tenderness. She exhibits no edema.  Lymphadenopathy:    She has no cervical adenopathy.  Neurological: She is alert and oriented to person, place, and time. She has normal reflexes. She displays normal reflexes. No cranial nerve deficit. She exhibits normal muscle tone. Coordination normal.  Skin: Skin is warm. No rash noted. She is not diaphoretic. No erythema. No pallor.  Psychiatric: She has a normal mood and affect. Her behavior is normal. Judgment and thought content normal.  Vitals  reviewed.         Assessment & Plan:  Gastroesophageal reflux disease without esophagitis - Plan: omeprazole (PRILOSEC) 40 MG capsule  Routine general medical examination at a health care facility - Plan: CBC with Differential/Platelet, COMPLETE METABOLIC PANEL WITH GFR, Lipid panel, Hepatitis panel, acute, DG Bone Density  Blood pressure is excellent. Physical exam is normal. I will check a CBC, CMP, fasting lipid panel. 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 She will received Pneumovax 23 today in clinic. She will received Prevnar 13 neck she year. I recommended a shingles vaccine. I will schedule the patient for a bone density test. I will treat acid reflux with Prilosec 40 mg by mouth daily. I recommended rest to allow the biceps tendinitis to improve. Recheck in 2 weeks if no better. The remainder of her preventative care is up-to-date

## 2015-07-16 NOTE — Addendum Note (Signed)
Addended by: Jenna Luo on: 07/16/2015 09:34 AM   Modules accepted: Orders

## 2015-07-17 ENCOUNTER — Other Ambulatory Visit: Payer: Self-pay | Admitting: Family Medicine

## 2015-07-17 LAB — HEPATITIS C ANTIBODY: HCV Ab: NEGATIVE

## 2015-07-19 ENCOUNTER — Encounter: Payer: Self-pay | Admitting: Family Medicine

## 2015-07-19 NOTE — Telephone Encounter (Signed)
Ok to send

## 2015-07-19 NOTE — Telephone Encounter (Signed)
ok 

## 2015-07-19 NOTE — Telephone Encounter (Signed)
Prescription sent to pharmacy.

## 2015-07-23 ENCOUNTER — Ambulatory Visit (HOSPITAL_COMMUNITY): Payer: BC Managed Care – PPO

## 2015-07-29 ENCOUNTER — Other Ambulatory Visit: Payer: Self-pay | Admitting: Family Medicine

## 2015-07-29 DIAGNOSIS — Z1231 Encounter for screening mammogram for malignant neoplasm of breast: Secondary | ICD-10-CM

## 2015-08-05 ENCOUNTER — Ambulatory Visit (HOSPITAL_COMMUNITY)
Admission: RE | Admit: 2015-08-05 | Discharge: 2015-08-05 | Disposition: A | Discharge status: Home / Self Care | Payer: Medicare Other | Source: Ambulatory Visit | Attending: Family Medicine | Admitting: Family Medicine

## 2015-08-05 DIAGNOSIS — Z1231 Encounter for screening mammogram for malignant neoplasm of breast: Secondary | ICD-10-CM | POA: Insufficient documentation

## 2015-08-07 ENCOUNTER — Other Ambulatory Visit: Payer: Self-pay | Admitting: Family Medicine

## 2015-08-11 ENCOUNTER — Other Ambulatory Visit (HOSPITAL_COMMUNITY): Payer: BC Managed Care – PPO

## 2015-08-16 ENCOUNTER — Other Ambulatory Visit (HOSPITAL_COMMUNITY): Payer: BC Managed Care – PPO

## 2015-08-17 ENCOUNTER — Telehealth: Payer: Self-pay | Admitting: Family Medicine

## 2015-08-17 NOTE — Telephone Encounter (Signed)
Called and spoke to pt - arm pain is no better. Per LOV ntbs if no better - appt made

## 2015-08-17 NOTE — Telephone Encounter (Signed)
Patient is calling to speak with your regarding still having arm pain  [phone] (M)

## 2015-08-19 ENCOUNTER — Ambulatory Visit (INDEPENDENT_AMBULATORY_CARE_PROVIDER_SITE_OTHER): Payer: Medicare Other | Admitting: Family Medicine

## 2015-08-19 ENCOUNTER — Encounter: Payer: Self-pay | Admitting: Family Medicine

## 2015-08-19 DIAGNOSIS — M7702 Medial epicondylitis, left elbow: Secondary | ICD-10-CM

## 2015-08-19 DIAGNOSIS — M7701 Medial epicondylitis, right elbow: Secondary | ICD-10-CM

## 2015-08-19 NOTE — Progress Notes (Signed)
Subjective:    Patient ID: Taylor Holmes, female    DOB: May 03, 1951, 65 y.o.   MRN: [id number]  HPI   patient reports several weeks of pain in her left elbow located over the medial epicondyles. She is tender to palpation distal to the medial epicondyles and around the medial epicondyles. She denies any falls or injuries. She is tried rest and ibuprofen with no improvement. She also has pain in her left shoulder with abduction greater than 90. She has positive empty can sign although it is mild. She also has pain with internal and external rotation. She denies any neck pain. She denies any numbness or tingling in her left arm. She denies any decreased grip strength. Past Medical History  Diagnosis Date  . Hypertension   . Hyperlipidemia   . Allergy    No past surgical history on file. Current Outpatient Prescriptions on File Prior to Visit  Medication Sig Dispense Refill  . cetirizine (ZYRTEC) 10 MG tablet Take 10 mg by mouth daily as needed for allergies.    . citalopram (CELEXA) 20 MG tablet Take 1 tablet (20 mg total) by mouth daily. 30 tablet 11  . hydrochlorothiazide (HYDRODIURIL) 25 MG tablet TAKE 1 TABLET EVERY DAY 30 tablet 0  . Melatonin 5 MG TABS Take 5 mg by mouth.    Marland Kitchen omeprazole (PRILOSEC) 40 MG capsule Take 1 capsule (40 mg total) by mouth daily. 30 capsule 3  . simvastatin (ZOCOR) 40 MG tablet TAKE 1 TABLET BY MOUTH AT BEDTIME 30 tablet 5  . TRAVATAN Z 0.004 % SOLN ophthalmic solution INSTILL 1DROP IN EACH AFFECTED EYE ONCE A DAY (IN THE EVENING) FOR 30 DAYS  12  . vitamin B-12 (CYANOCOBALAMIN) 1000 MCG tablet Take 1,000 mcg by mouth daily.    . vitamin E 400 UNIT capsule Take 400 Units by mouth daily.    Marland Kitchen ZOSTAVAX 91478 UNT/0.65ML injection TO BE ADMINISTERED BY PHARMACIST FOR IMMUNIZATION 1 mL 0   No current facility-administered medications on file prior to visit.   No Known Allergies Social History   Social History  . Marital Status: Married    Spouse Name:  N/A  . Number of Children: N/A  . Years of Education: N/A   Occupational History  . Not on file.   Social History Main Topics  . Smoking status: Never Smoker   . Smokeless tobacco: Never Used  . Alcohol Use: No  . Drug Use: No  . Sexual Activity: Yes   Other Topics Concern  . Not on file   Social History Narrative     Review of Systems  All other systems reviewed and are negative.      Objective:   Physical Exam  Cardiovascular: Normal rate, regular rhythm and normal heart sounds.   Pulmonary/Chest: Effort normal and breath sounds normal.  Musculoskeletal:       Left shoulder: She exhibits decreased range of motion, tenderness and pain. She exhibits no bony tenderness.       Left elbow: She exhibits decreased range of motion. Tenderness found. Medial epicondyle tenderness noted.  Vitals reviewed.         Assessment & Plan:  Medial epicondylitis of elbow, right   Patient has medial epicondylitis of the left elbow. She also appears to have bursitis in her left shoulder possible rotator cuff tendinitis. Using sterile technique, I injected her left elbow just distal to the medial epicondyles at the point of maximum tenderness. The patient tolerated the procedure well.  I injected a mixture of 1/2 mL of lidocaine and 1/2 mL of 40 mg per mL Kenalog. If the left shoulder continues to hurt, she can return for cortisone injection in her left shoulder versus physical therapy

## 2015-08-30 ENCOUNTER — Encounter: Payer: Self-pay | Admitting: Gastroenterology

## 2015-09-04 ENCOUNTER — Other Ambulatory Visit: Payer: Self-pay | Admitting: Family Medicine

## 2015-10-19 ENCOUNTER — Encounter: Payer: Self-pay | Admitting: Gastroenterology

## 2015-11-14 ENCOUNTER — Other Ambulatory Visit: Payer: Self-pay | Admitting: Family Medicine

## 2015-11-24 ENCOUNTER — Encounter: Payer: Self-pay | Admitting: Gastroenterology

## 2015-11-26 ENCOUNTER — Ambulatory Visit (AMBULATORY_SURGERY_CENTER): Payer: Self-pay

## 2015-11-26 VITALS — Ht 62.0 in | Wt 164.6 lb

## 2015-11-26 DIAGNOSIS — Z860100 Personal history of colon polyps, unspecified: Secondary | ICD-10-CM

## 2015-11-26 MED ORDER — SUPREP BOWEL PREP KIT 17.5-3.13-1.6 GM/177ML PO SOLN: 1.0000 | Freq: Once | ORAL | Status: DC

## 2015-11-26 NOTE — Progress Notes (Signed)
No allergies to eggs or soy No past problems with anesthesia No home oxygen No diet meds  Has email and internet; refused emmi 

## 2015-12-03 ENCOUNTER — Encounter: Payer: Self-pay | Admitting: Gastroenterology

## 2015-12-03 ENCOUNTER — Ambulatory Visit (AMBULATORY_SURGERY_CENTER): Payer: Medicare Other | Admitting: Gastroenterology

## 2015-12-03 VITALS — BP 167/75 | HR 69 | Resp 11

## 2015-12-03 DIAGNOSIS — D125 Benign neoplasm of sigmoid colon: Secondary | ICD-10-CM

## 2015-12-03 DIAGNOSIS — K635 Polyp of colon: Secondary | ICD-10-CM | POA: Diagnosis not present

## 2015-12-03 DIAGNOSIS — Z86010 Personal history of colonic polyps: Secondary | ICD-10-CM

## 2015-12-03 NOTE — Progress Notes (Signed)
Patient awakening,vss,report to rn 

## 2015-12-03 NOTE — Progress Notes (Signed)
Called to room to assist during endoscopic procedure.  Patient ID and intended procedure confirmed with present staff. Received instructions for my participation in the procedure from the performing physician.  

## 2015-12-03 NOTE — Patient Instructions (Signed)
YOU HAD AN ENDOSCOPIC PROCEDURE TODAY AT [facility]:   Refer to the procedure report that was given to you for any specific questions about what was found during the examination.  If the procedure report does not answer your questions, please call your gastroenterologist to clarify.  If you requested that your care partner not be given the details of your procedure findings, then the procedure report has been included in a sealed envelope for you to review at your convenience later.  YOU SHOULD EXPECT: Some feelings of bloating in the abdomen. Passage of more gas than usual.  Walking can help get rid of the air that was put into your GI tract during the procedure and reduce the bloating. If you had a lower endoscopy (such as a colonoscopy or flexible sigmoidoscopy) you may notice spotting of blood in your stool or on the toilet paper. If you underwent a bowel prep for your procedure, you may not have a normal bowel movement for a few days.  Please Note:  You might notice some irritation and congestion in your nose or some drainage.  This is from the oxygen used during your procedure.  There is no need for concern and it should clear up in a day or so.  SYMPTOMS TO REPORT IMMEDIATELY:   Following lower endoscopy (colonoscopy or flexible sigmoidoscopy):  Excessive amounts of blood in the stool  Significant tenderness or worsening of abdominal pains  Swelling of the abdomen that is new, acute  Fever of 100F or higher   For urgent or emergent issues, a gastroenterologist can be reached at any hour by calling [phone].   DIET: Your first meal following the procedure should be a small meal and then it is ok to progress to your normal diet. Heavy or fried foods are harder to digest and may make you feel nauseous or bloated.  Likewise, meals heavy in dairy and vegetables can increase bloating.  Drink plenty of fluids but you should avoid alcoholic beverages for 24  hours.  ACTIVITY:  You should plan to take it easy for the rest of today and you should NOT DRIVE or use heavy machinery until tomorrow (because of the sedation medicines used during the test).    FOLLOW UP: Our staff will call the number listed on your records the next business day following your procedure to check on you and address any questions or concerns that you may have regarding the information given to you following your procedure. If we do not reach you, we will leave a message.  However, if you are feeling well and you are not experiencing any problems, there is no need to return our call.  We will assume that you have returned to your regular daily activities without incident.  If any biopsies were taken you will be contacted by phone or by letter within the next 1-3 weeks.  Please call us at [phone] if you have not heard about the biopsies in 3 weeks.    SIGNATURES/CONFIDENTIALITY: You and/or your care partner have signed paperwork which will be entered into your electronic medical record.  These signatures attest to the fact that that the information above on your After Visit Summary has been reviewed and is understood.  Full responsibility of the confidentiality of this discharge information lies with you and/or your care-partner.  Polyps, diverticulosis, high fiber diet-handouts given  Repeat colonoscopy in 5 years 2022.

## 2015-12-03 NOTE — Op Note (Signed)
[facility] Patient Name: Taylor Holmes Procedure Date: 12/03/2015 1:58 PM MRN: [id number] Endoscopist: Ladene Artist , MD Age: 65 Date of Birth: 1951-04-12 Gender: Female Procedure:                Colonoscopy Indications:              Surveillance: Personal history of adenomatous                            polyps on last colonoscopy > 5 years ago Medicines:                Monitored Anesthesia Care Procedure:                Pre-Anesthesia Assessment:                           - Prior to the procedure, a History and Physical                            was performed, and patient medications and                            allergies were reviewed. The patient's tolerance of                            previous anesthesia was also reviewed. The risks                            and benefits of the procedure and the sedation                            options and risks were discussed with the patient.                            All questions were answered, and informed consent                            was obtained. Prior Anticoagulants: The patient has                            taken no previous anticoagulant or antiplatelet                            agents. ASA Grade Assessment: II - A patient with                            mild systemic disease. After reviewing the risks                            and benefits, the patient was deemed in                            satisfactory condition to undergo the procedure.  After obtaining informed consent, the colonoscope                            was passed under direct vision. Throughout the                            procedure, the patient's blood pressure, pulse, and                            oxygen saturations were monitored continuously. The                            Model PCF-H190L [id number]) scope was introduced                            through the anus and advanced to the the cecum,                    identified by appendiceal orifice and ileocecal                            valve. The colonoscopy was performed without                            difficulty. The patient tolerated the procedure                            well. The quality of the bowel preparation was                            good. The ileocecal valve, appendiceal orifice, and                            rectum were photographed. Scope In: 2:11:05 PM Scope Out: 2:25:57 PM Scope Withdrawal Time: 0 hours 12 minutes 52 seconds  Total Procedure Duration: 0 hours 14 minutes 52 seconds  Findings:                 The digital rectal exam was normal.                           Two sessile polyps were found in the sigmoid colon.                            The polyps were 4 to 5 mm in size. These polyps                            were removed with a cold biopsy forceps. Resection                            and retrieval were complete.                           Multiple small-mouthed diverticula were found in  the sigmoid colon. There was narrowing of the colon                            in association with the diverticular opening.                           A few small-mouthed diverticula were found in the                            descending colon and transverse colon. There was no                            evidence of diverticular bleeding.                           The exam was otherwise normal throughout the                            examined colon.                           The retroflexed view of the distal rectum and anal                            verge was normal and showed no anal or rectal                            abnormalities. Complications:            No immediate complications. Estimated Blood Loss:     Estimated blood loss: none. Impression:               - Two 4 to 5 mm polyps in the sigmoid colon,                            removed with a cold biopsy forceps.  Resected and                            retrieved.                           - Mild diverticulosis in the sigmoid colon.                           - Mild diverticulosis in the descending colon and                            in the transverse colon. Recommendation:           - Patient has a contact number available for                            emergencies. The signs and symptoms of potential                            delayed complications were  discussed with the                            patient. Return to normal activities tomorrow.                            Written discharge instructions were provided to the                            patient.                           - High fiber diet.                           - Continue present medications.                           - Await pathology results.                           - Repeat colonoscopy in 5 years for surveillance. Ladene Artist, MD 12/03/2015 2:32:11 PM This report has been signed electronically.

## 2015-12-06 ENCOUNTER — Telehealth: Payer: Self-pay | Admitting: *Deleted

## 2015-12-06 NOTE — Telephone Encounter (Signed)
Tried to call number multiple times, must be having phone troubles we keep getting disc,follow-up, can not leave message

## 2015-12-10 ENCOUNTER — Encounter: Payer: Medicare Other | Admitting: Gastroenterology

## 2015-12-10 ENCOUNTER — Encounter: Payer: Self-pay | Admitting: Gastroenterology

## 2016-02-11 ENCOUNTER — Other Ambulatory Visit: Payer: Self-pay | Admitting: Family Medicine

## 2016-02-19 ENCOUNTER — Other Ambulatory Visit: Payer: Self-pay | Admitting: Family Medicine

## 2016-06-14 ENCOUNTER — Other Ambulatory Visit: Payer: Self-pay | Admitting: Family Medicine

## 2016-06-14 MED ORDER — CITALOPRAM HYDROBROMIDE 20 MG PO TABS: 20.0000 mg | ORAL_TABLET | Freq: Every day | ORAL | 3 refills | Status: DC

## 2016-06-28 ENCOUNTER — Encounter: Payer: Self-pay | Admitting: Family Medicine

## 2016-06-28 ENCOUNTER — Ambulatory Visit (INDEPENDENT_AMBULATORY_CARE_PROVIDER_SITE_OTHER): Payer: Medicare Other | Admitting: Family Medicine

## 2016-06-28 VITALS — BP 160/90 | HR 60 | Temp 98.9°F | Resp 16 | Ht 62.0 in | Wt 169.0 lb

## 2016-06-28 DIAGNOSIS — E7800 Pure hypercholesterolemia, unspecified: Secondary | ICD-10-CM | POA: Diagnosis not present

## 2016-06-28 DIAGNOSIS — Z23 Encounter for immunization: Secondary | ICD-10-CM | POA: Diagnosis not present

## 2016-06-28 DIAGNOSIS — I10 Essential (primary) hypertension: Secondary | ICD-10-CM | POA: Diagnosis not present

## 2016-06-28 LAB — CBC WITH DIFFERENTIAL/PLATELET
Basophils Absolute: 0 cells/uL (ref 0–200)
Basophils Relative: 0 %
EOS ABS: 156 {cells}/uL (ref 15–500)
Eosinophils Relative: 2 %
HEMATOCRIT: 36.4 % (ref 35.0–45.0)
Hemoglobin: 11.9 g/dL — ABNORMAL LOW (ref 12.0–15.0)
LYMPHS ABS: 3042 {cells}/uL (ref 850–3900)
LYMPHS PCT: 39 %
MCH: 29.8 pg (ref 27.0–33.0)
MCHC: 32.7 g/dL (ref 32.0–36.0)
MCV: 91.2 fL (ref 80.0–100.0)
MONO ABS: 624 {cells}/uL (ref 200–950)
MPV: 8.9 fL (ref 7.5–12.5)
Monocytes Relative: 8 %
Neutro Abs: 3978 cells/uL (ref 1500–7800)
Neutrophils Relative %: 51 %
Platelets: 220 10*3/uL (ref 140–400)
RBC: 3.99 MIL/uL (ref 3.80–5.10)
RDW: 14.3 % (ref 11.0–15.0)
WBC: 7.8 10*3/uL (ref 3.8–10.8)

## 2016-06-28 MED ORDER — VALSARTAN 160 MG PO TABS: 160.0000 mg | ORAL_TABLET | Freq: Every day | ORAL | 5 refills | Status: DC

## 2016-06-28 NOTE — Addendum Note (Signed)
Addended by: Shary Decamp B on: 06/28/2016 12:52 PM   Modules accepted: Orders

## 2016-06-28 NOTE — Progress Notes (Signed)
Subjective:    Patient ID: Taylor Holmes, female    DOB: 02/20/1951, 65 y.o.   MRN: [id number]  HPI  Here today for follow-up of her hypertension and hyperlipidemia. She's currently on hydrochlorothiazide. Her blood pressure today is elevated at 160/90. She denies any chest pain shortness of breath or dyspnea on exertion. She's not getting any regular exercise. She is under tremendous amount of stress caring for her parents both of whom have dementia. She denies any myalgias or right upper quadrant pain on her statin medication Past Medical History:  Diagnosis Date  . Allergy   . Hyperlipidemia   . Hypertension    Past Surgical History:  Procedure Laterality Date  . ABDOMINAL HYSTERECTOMY     Current Outpatient Prescriptions on File Prior to Visit  Medication Sig Dispense Refill  . cetirizine (ZYRTEC) 10 MG tablet Take 10 mg by mouth daily as needed for allergies.    . citalopram (CELEXA) 20 MG tablet Take 1 tablet (20 mg total) by mouth daily. 90 tablet 3  . hydrochlorothiazide (HYDRODIURIL) 25 MG tablet TAKE 1 TABLET EVERY DAY 30 tablet 5  . Melatonin 10 MG TABS Take by mouth.    . Melatonin 5 MG TABS Take 5 mg by mouth. Reported on 12/03/2015    . omeprazole (PRILOSEC) 40 MG capsule TAKE 1 CAPSULE (40 MG TOTAL) BY MOUTH DAILY. 30 capsule 11  . simvastatin (ZOCOR) 40 MG tablet TAKE 1 TABLET BY MOUTH AT BEDTIME 30 tablet 5  . TRAVATAN Z 0.004 % SOLN ophthalmic solution INSTILL 1DROP IN EACH AFFECTED EYE ONCE A DAY (IN THE EVENING) FOR 30 DAYS  12  . vitamin B-12 (CYANOCOBALAMIN) 1000 MCG tablet Take 1,000 mcg by mouth daily.     No current facility-administered medications on file prior to visit.    No Known Allergies Social History   Social History  . Marital status: Married    Spouse name: N/A  . Number of children: N/A  . Years of education: N/A   Occupational History  . Not on file.   Social History Main Topics  . Smoking status: Never Smoker  . Smokeless  tobacco: Never Used  . Alcohol use 0.0 oz/week     Comment: "wine every now and again"  . Drug use: No  . Sexual activity: Yes   Other Topics Concern  . Not on file   Social History Narrative  . No narrative on file     Review of Systems  All other systems reviewed and are negative.      Objective:   Physical Exam  Neck: Neck supple. No JVD present. No thyromegaly present.  Cardiovascular: Normal rate, regular rhythm and normal heart sounds.  Exam reveals no gallop.   No murmur heard. Pulmonary/Chest: Effort normal and breath sounds normal. No respiratory distress. She has no wheezes. She has no rales.  Abdominal: Soft. Bowel sounds are normal. She exhibits no distension. There is no tenderness. There is no rebound and no guarding.  Musculoskeletal: She exhibits no edema.  Vitals reviewed.         Assessment & Plan:  Benign essential HTN - Plan: CBC with Differential/Platelet, COMPLETE METABOLIC PANEL WITH GFR, Lipid panel  Pure hypercholesterolemia  Patient's blood pressure is too high. Therefore I will start the patient on valsartan 160 mg in addition to her hydrochlorothiazide. Recheck blood pressure in 2 weeks. I will also check a fasting lipid panel. Her goal LDL cholesterol is less than 130. Flu shot  is up-to-date. However she received her pneumonia vaccine today. She is due for her mammogram in December. Her colonoscopy is up-to-date. Pap smear is unnecessary given her age. I did recommend a bone density test but she declined.

## 2016-06-29 LAB — COMPLETE METABOLIC PANEL WITH GFR
ALK PHOS: 66 U/L (ref 33–130)
ALT: 17 U/L (ref 6–29)
AST: 18 U/L (ref 10–35)
Albumin: 3.9 g/dL (ref 3.6–5.1)
BILIRUBIN TOTAL: 0.3 mg/dL (ref 0.2–1.2)
BUN: 15 mg/dL (ref 7–25)
CHLORIDE: 107 mmol/L (ref 98–110)
CO2: 27 mmol/L (ref 20–31)
CREATININE: 0.74 mg/dL (ref 0.50–0.99)
Calcium: 8.5 mg/dL — ABNORMAL LOW (ref 8.6–10.4)
GFR, Est Non African American: 85 mL/min (ref >=60)
Glucose, Bld: 75 mg/dL (ref 70–99)
Potassium: 4 mmol/L (ref 3.5–5.3)
Sodium: 142 mmol/L (ref 135–146)
TOTAL PROTEIN: 6.7 g/dL (ref 6.1–8.1)

## 2016-06-29 LAB — LIPID PANEL
Cholesterol: 159 mg/dL (ref <200)
HDL: 52 mg/dL (ref >50)
LDL Cholesterol: 92 mg/dL (ref <100)
TRIGLYCERIDES: 75 mg/dL (ref <150)
Total CHOL/HDL Ratio: 3.1 Ratio (ref <5.0)
VLDL: 15 mg/dL (ref <30)

## 2016-07-06 ENCOUNTER — Encounter: Payer: Self-pay | Admitting: Family Medicine

## 2016-08-01 ENCOUNTER — Other Ambulatory Visit: Payer: Self-pay | Admitting: Family Medicine

## 2016-08-01 DIAGNOSIS — Z1231 Encounter for screening mammogram for malignant neoplasm of breast: Secondary | ICD-10-CM

## 2016-08-04 ENCOUNTER — Other Ambulatory Visit: Payer: Self-pay | Admitting: Family Medicine

## 2016-08-10 ENCOUNTER — Ambulatory Visit (HOSPITAL_COMMUNITY)
Admission: RE | Admit: 2016-08-10 | Discharge: 2016-08-10 | Disposition: A | Discharge status: Home / Self Care | Payer: Medicare Other | Source: Ambulatory Visit | Attending: Family Medicine | Admitting: Family Medicine

## 2016-08-10 DIAGNOSIS — Z1231 Encounter for screening mammogram for malignant neoplasm of breast: Secondary | ICD-10-CM | POA: Diagnosis present

## 2017-01-11 ENCOUNTER — Other Ambulatory Visit: Payer: Self-pay | Admitting: Family Medicine

## 2017-01-20 ENCOUNTER — Other Ambulatory Visit: Payer: Self-pay | Admitting: Family Medicine

## 2017-04-01 ENCOUNTER — Other Ambulatory Visit: Payer: Self-pay | Admitting: Family Medicine

## 2017-04-07 ENCOUNTER — Other Ambulatory Visit: Payer: Self-pay | Admitting: Family Medicine

## 2017-04-12 ENCOUNTER — Telehealth: Payer: Self-pay | Admitting: *Deleted

## 2017-04-12 NOTE — Telephone Encounter (Signed)
Received call from patient.   Requested alternative to Valsartan D/T recall.   MD please advise.

## 2017-04-13 MED ORDER — LOSARTAN POTASSIUM 100 MG PO TABS: 100.0000 mg | ORAL_TABLET | Freq: Every day | ORAL | 3 refills | Status: DC

## 2017-04-13 NOTE — Telephone Encounter (Signed)
Switch to losartan 100 mg poqday

## 2017-04-13 NOTE — Telephone Encounter (Signed)
Prescription sent to pharmacy.   Call placed to patient and patient made aware per VM. 

## 2017-06-16 LAB — HEMOGLOBIN A1C: Hemoglobin A1C: 5.9

## 2017-08-06 ENCOUNTER — Other Ambulatory Visit: Payer: Self-pay | Admitting: Family Medicine

## 2017-08-06 DIAGNOSIS — Z1231 Encounter for screening mammogram for malignant neoplasm of breast: Secondary | ICD-10-CM

## 2017-08-13 ENCOUNTER — Ambulatory Visit (HOSPITAL_COMMUNITY)
Admission: RE | Admit: 2017-08-13 | Discharge: 2017-08-13 | Disposition: A | Discharge status: Home / Self Care | Payer: Medicare Other | Source: Ambulatory Visit | Attending: Family Medicine | Admitting: Family Medicine

## 2017-08-13 DIAGNOSIS — Z1231 Encounter for screening mammogram for malignant neoplasm of breast: Secondary | ICD-10-CM | POA: Diagnosis present

## 2017-08-16 ENCOUNTER — Ambulatory Visit (INDEPENDENT_AMBULATORY_CARE_PROVIDER_SITE_OTHER): Payer: Medicare Other | Admitting: Family Medicine

## 2017-08-16 ENCOUNTER — Encounter: Payer: Self-pay | Admitting: Family Medicine

## 2017-08-16 VITALS — BP 118/68 | HR 72 | Temp 97.7°F | Resp 16 | Ht 62.0 in | Wt 170.0 lb

## 2017-08-16 DIAGNOSIS — M7552 Bursitis of left shoulder: Secondary | ICD-10-CM | POA: Diagnosis not present

## 2017-08-16 NOTE — Progress Notes (Signed)
Subjective:    Patient ID: Taylor Holmes, female    DOB: [birth year], 67 y.o.   MRN: [id number]  HPI Patient reports 3-4 weeks of pain in her left shoulder. Pain is located in the subacromial space and on the lateral aspect of the deltoid. Pain is worse with abduction greater than 90. She has no pain with internal or external rotation. However empty can sign is significant for weakness as well as severe pain. O'Brien's test positive for pain. Spurling's test is negative. There is no weakness in her left arm. She has normal reflexes when checked at the biceps as well as the brachioradialis. Grip strength is normal. She denies any numbness or tingling in her left arm. However she does have burning in her left shoulder. However I believe this is burning secondary to bursitis rather than cervical radiculopathy given the remainder of her exam is normal Past Medical History:  Diagnosis Date  . Allergy   . Hyperlipidemia   . Hypertension    Past Surgical History:  Procedure Laterality Date  . ABDOMINAL HYSTERECTOMY     Current Outpatient Medications on File Prior to Visit  Medication Sig Dispense Refill  . cetirizine (ZYRTEC) 10 MG tablet Take 10 mg by mouth daily as needed for allergies.    . citalopram (CELEXA) 20 MG tablet TAKE 1 TABLET (20 MG TOTAL) BY MOUTH DAILY. 90 tablet 3  . hydrochlorothiazide (HYDRODIURIL) 25 MG tablet TAKE 1 TABLET EVERY DAY 30 tablet 5  . losartan (COZAAR) 100 MG tablet Take 1 tablet (100 mg total) by mouth daily. 90 tablet 3  . Melatonin 10 MG TABS Take by mouth.    Marland Kitchen omeprazole (PRILOSEC) 40 MG capsule TAKE 1 CAPSULE (40 MG TOTAL) BY MOUTH DAILY. 30 capsule 11  . simvastatin (ZOCOR) 40 MG tablet TAKE 1 TABLET BY MOUTH AT BEDTIME 30 tablet 5  . TRAVATAN Z 0.004 % SOLN ophthalmic solution INSTILL 1DROP IN EACH AFFECTED EYE ONCE A DAY (IN THE EVENING) FOR 30 DAYS  12  . vitamin B-12 (CYANOCOBALAMIN) 1000 MCG tablet Take 1,000 mcg by mouth daily.     No current  facility-administered medications on file prior to visit.    No Known Allergies Social History   Socioeconomic History  . Marital status: Married    Spouse name: Not on file  . Number of children: Not on file  . Years of education: Not on file  . Highest education level: Not on file  Social Needs  . Financial resource strain: Not on file  . Food insecurity - worry: Not on file  . Food insecurity - inability: Not on file  . Transportation needs - medical: Not on file  . Transportation needs - non-medical: Not on file  Occupational History  . Not on file  Tobacco Use  . Smoking status: Never Smoker  . Smokeless tobacco: Never Used  Substance and Sexual Activity  . Alcohol use: Yes    Alcohol/week: 0.0 oz    Comment: "wine every now and again"  . Drug use: No  . Sexual activity: Yes  Other Topics Concern  . Not on file  Social History Narrative  . Not on file      Review of Systems  All other systems reviewed and are negative.      Objective:   Physical Exam  Constitutional: She appears well-developed and well-nourished.  Cardiovascular: Normal rate and regular rhythm.  Pulmonary/Chest: Effort normal and breath sounds normal.  Musculoskeletal:  Left shoulder: She exhibits decreased range of motion, tenderness, pain and decreased strength. She exhibits no bony tenderness, no swelling, no effusion, no crepitus and no spasm.  Vitals reviewed.         Assessment & Plan:  Subacromial bursitis of left shoulder joint  Cannot exclude a tear in the rotator cuff however I believe the majority of this is bursitis as well as tendinitis/impingement syndrome using sterile technique, I injected the left shoulder with 2 mL of lidocaine, 2 mL of Marcaine, and 2 mL of 40 mg per mL Kenalog. Patient tolerated procedure well without complication. Patient is Rh tried and failed 3-4 weeks of NSAIDs on a daily basis without improvement

## 2017-09-28 ENCOUNTER — Other Ambulatory Visit: Payer: Self-pay | Admitting: Family Medicine

## 2017-10-01 ENCOUNTER — Other Ambulatory Visit: Payer: Self-pay | Admitting: *Deleted

## 2017-10-01 MED ORDER — SIMVASTATIN 40 MG PO TABS: 40.0000 mg | ORAL_TABLET | Freq: Every day | ORAL | 1 refills | Status: DC

## 2017-11-30 ENCOUNTER — Encounter: Payer: Self-pay | Admitting: Family Medicine

## 2017-11-30 ENCOUNTER — Ambulatory Visit: Payer: Medicare Other | Admitting: Family Medicine

## 2017-11-30 VITALS — BP 130/70 | HR 70 | Temp 98.3°F | Resp 14 | Ht 62.0 in | Wt 169.0 lb

## 2017-11-30 DIAGNOSIS — M549 Dorsalgia, unspecified: Secondary | ICD-10-CM

## 2017-11-30 MED ORDER — MELOXICAM 15 MG PO TABS: 15.0000 mg | ORAL_TABLET | Freq: Every day | ORAL | 0 refills | Status: DC

## 2017-11-30 NOTE — Progress Notes (Signed)
Subjective:    Patient ID: Taylor Holmes, female    DOB: Aug 12, 1950, 67 y.o.   MRN: [id number]  HPI Patient presents with 2 weeks of pain in the middle of her back approximately at the level of T10.  The pain is described as a deep ache.  It is made worse by prolonged standing.  At other times she will feel sharp pain.  On the surface of the skin there, there is a seborrheic keratosis and she was concerned that this was the cause of her pain.  However it is normal in appearance.  It is 6 mm in diameter and has no concerning features.  Furthermore the pain is much deeper.  There is no palpable abnormality in that area.  She denies any numbness or tingling in the legs or weakness in the legs.  She denies any fevers or chills.  She denies any falls or injuries Past Medical History:  Diagnosis Date  . Allergy   . Hyperlipidemia   . Hypertension    Past Surgical History:  Procedure Laterality Date  . ABDOMINAL HYSTERECTOMY     Current Outpatient Medications on File Prior to Visit  Medication Sig Dispense Refill  . cetirizine (ZYRTEC) 10 MG tablet Take 10 mg by mouth daily as needed for allergies.    . citalopram (CELEXA) 20 MG tablet TAKE 1 TABLET (20 MG TOTAL) BY MOUTH DAILY. 90 tablet 3  . hydrochlorothiazide (HYDRODIURIL) 25 MG tablet TAKE 1 TABLET EVERY DAY 30 tablet 5  . losartan (COZAAR) 100 MG tablet Take 1 tablet (100 mg total) by mouth daily. 90 tablet 3  . Melatonin 10 MG TABS Take by mouth.    Marland Kitchen omeprazole (PRILOSEC) 40 MG capsule TAKE 1 CAPSULE (40 MG TOTAL) BY MOUTH DAILY. 30 capsule 11  . simvastatin (ZOCOR) 40 MG tablet Take 1 tablet (40 mg total) by mouth at bedtime. 90 tablet 1  . TRAVATAN Z 0.004 % SOLN ophthalmic solution INSTILL 1DROP IN EACH AFFECTED EYE ONCE A DAY (IN THE EVENING) FOR 30 DAYS  12  . vitamin B-12 (CYANOCOBALAMIN) 1000 MCG tablet Take 1,000 mcg by mouth daily.     No current facility-administered medications on file prior to visit.    No Known  Allergies Social History   Socioeconomic History  . Marital status: Married    Spouse name: Not on file  . Number of children: Not on file  . Years of education: Not on file  . Highest education level: Not on file  Occupational History  . Not on file  Social Needs  . Financial resource strain: Not on file  . Food insecurity:    Worry: Not on file    Inability: Not on file  . Transportation needs:    Medical: Not on file    Non-medical: Not on file  Tobacco Use  . Smoking status: Never Smoker  . Smokeless tobacco: Never Used  Substance and Sexual Activity  . Alcohol use: Yes    Alcohol/week: 0.0 oz    Comment: "wine every now and again"  . Drug use: No  . Sexual activity: Yes  Lifestyle  . Physical activity:    Days per week: Not on file    Minutes per session: Not on file  . Stress: Not on file  Relationships  . Social connections:    Talks on phone: Not on file    Gets together: Not on file    Attends religious service: Not on file  Active member of club or organization: Not on file    Attends meetings of clubs or organizations: Not on file    Relationship status: Not on file  . Intimate partner violence:    Fear of current or ex partner: Not on file    Emotionally abused: Not on file    Physically abused: Not on file    Forced sexual activity: Not on file  Other Topics Concern  . Not on file  Social History Narrative  . Not on file      Review of Systems  All other systems reviewed and are negative.      Objective:   Physical Exam  Cardiovascular: Normal rate, regular rhythm and normal heart sounds.  Pulmonary/Chest: Breath sounds normal. No stridor. No respiratory distress. She has no wheezes. She has no rales.  Musculoskeletal:       Thoracic back: She exhibits pain. She exhibits normal range of motion, no tenderness and no bony tenderness.       Back:  Vitals reviewed.         Assessment & Plan:  Mid back pain  The seborrheic  keratosis is completely benign.  I reassured the patient and requires no treatment.  I believe the pain is coming likely from degenerative disc disease or muscle spasm in the center of her back.  I recommend to try meloxicam 15 mg a day for 2 weeks and then reassess.  If the pain persist, I would send the patient for an x-ray of the thoracic spine

## 2017-12-03 ENCOUNTER — Encounter: Payer: Self-pay | Admitting: Family Medicine

## 2017-12-30 ENCOUNTER — Other Ambulatory Visit: Payer: Self-pay | Admitting: Family Medicine

## 2018-01-15 ENCOUNTER — Encounter: Payer: Self-pay | Admitting: Family Medicine

## 2018-04-29 ENCOUNTER — Other Ambulatory Visit: Payer: Self-pay | Admitting: Family Medicine

## 2018-05-27 ENCOUNTER — Encounter: Payer: Self-pay | Admitting: Family Medicine

## 2018-05-27 ENCOUNTER — Ambulatory Visit: Payer: Medicare Other | Admitting: Family Medicine

## 2018-05-27 VITALS — BP 124/60 | HR 78 | Temp 98.2°F | Resp 16 | Ht 62.0 in | Wt 166.0 lb

## 2018-05-27 DIAGNOSIS — Z23 Encounter for immunization: Secondary | ICD-10-CM

## 2018-05-27 DIAGNOSIS — M5412 Radiculopathy, cervical region: Secondary | ICD-10-CM | POA: Diagnosis not present

## 2018-05-27 DIAGNOSIS — M5410 Radiculopathy, site unspecified: Secondary | ICD-10-CM

## 2018-05-27 MED ORDER — GABAPENTIN 300 MG PO CAPS: 300.0000 mg | ORAL_CAPSULE | Freq: Three times a day (TID) | ORAL | 3 refills | Status: DC

## 2018-05-27 NOTE — Progress Notes (Signed)
Subjective:    Patient ID: Taylor Holmes, female    DOB: [birth year], 67 y.o.   MRN: [id number]  HPI Patient presents with 1 month of pain starting in the left side of her neck and radiating down her left arm into her left hand.  She describes it as a burning stinging pain.  At times she feels numbness.  She has normal reflexes when checked at the triceps, the biceps, and the brachioradialis.  Muscle strength is 5/5 equal and symmetric in the upper and lower extremities.  She has normal sensation.  She has normal range of motion in the neck without eliciting any pain.  She does have some pain and stiffness in her left trapezius muscle.  There is no tenderness to palpation in the spinous processes of the cervical spine or in the left cervical paraspinal muscle area Past Medical History:  Diagnosis Date  . Allergy   . Hyperlipidemia   . Hypertension    Past Surgical History:  Procedure Laterality Date  . ABDOMINAL HYSTERECTOMY     Current Outpatient Medications on File Prior to Visit  Medication Sig Dispense Refill  . cetirizine (ZYRTEC) 10 MG tablet Take 10 mg by mouth daily as needed for allergies.    . citalopram (CELEXA) 20 MG tablet TAKE 1 TABLET (20 MG TOTAL) BY MOUTH DAILY. 90 tablet 3  . hydrochlorothiazide (HYDRODIURIL) 25 MG tablet TAKE 1 TABLET BY MOUTH EVERY DAY 90 tablet 3  . losartan (COZAAR) 100 MG tablet TAKE 1 TABLET BY MOUTH EVERY DAY 90 tablet 3  . Melatonin 10 MG TABS Take by mouth.    . meloxicam (MOBIC) 15 MG tablet Take 1 tablet (15 mg total) by mouth daily. 30 tablet 0  . omeprazole (PRILOSEC) 40 MG capsule TAKE 1 CAPSULE (40 MG TOTAL) BY MOUTH DAILY. 30 capsule 11  . simvastatin (ZOCOR) 40 MG tablet Take 1 tablet (40 mg total) by mouth at bedtime. 90 tablet 1  . TRAVATAN Z 0.004 % SOLN ophthalmic solution INSTILL 1DROP IN EACH AFFECTED EYE ONCE A DAY (IN THE EVENING) FOR 30 DAYS  12  . vitamin B-12 (CYANOCOBALAMIN) 1000 MCG tablet Take 1,000 mcg by mouth daily.      No current facility-administered medications on file prior to visit.    No Known Allergies Social History   Socioeconomic History  . Marital status: Married    Spouse name: Not on file  . Number of children: Not on file  . Years of education: Not on file  . Highest education level: Not on file  Occupational History  . Not on file  Social Needs  . Financial resource strain: Not on file  . Food insecurity:    Worry: Not on file    Inability: Not on file  . Transportation needs:    Medical: Not on file    Non-medical: Not on file  Tobacco Use  . Smoking status: Never Smoker  . Smokeless tobacco: Never Used  Substance and Sexual Activity  . Alcohol use: Yes    Alcohol/week: 0.0 standard drinks    Comment: "wine every now and again"  . Drug use: No  . Sexual activity: Yes  Lifestyle  . Physical activity:    Days per week: Not on file    Minutes per session: Not on file  . Stress: Not on file  Relationships  . Social connections:    Talks on phone: Not on file    Gets together: Not on file  Attends religious service: Not on file    Active member of club or organization: Not on file    Attends meetings of clubs or organizations: Not on file    Relationship status: Not on file  . Intimate partner violence:    Fear of current or ex partner: Not on file    Emotionally abused: Not on file    Physically abused: Not on file    Forced sexual activity: Not on file  Other Topics Concern  . Not on file  Social History Narrative  . Not on file      Review of Systems  Musculoskeletal: Positive for neck pain.  All other systems reviewed and are negative.      Objective:   Physical Exam  Constitutional: She is oriented to person, place, and time.  Cardiovascular: Normal rate, regular rhythm and normal heart sounds.  Pulmonary/Chest: Breath sounds normal. No stridor. No respiratory distress. She has no wheezes. She has no rales.  Musculoskeletal:       Cervical  back: She exhibits pain. She exhibits normal range of motion, no tenderness and no bony tenderness.       Back:  Neurological: She is alert and oriented to person, place, and time. She displays normal reflexes. No cranial nerve deficit or sensory deficit. She exhibits normal muscle tone. Coordination normal.  Vitals reviewed.         Assessment & Plan:  Radiculopathy, unspecified spinal region  Cervical radiculopathy - Plan: DG Cervical Spine Complete, gabapentin (NEURONTIN) 300 MG capsule  Obtain x-ray of the cervical spine to evaluate for any structural lesions that would explain her cervical radiculopathy.  Begin gabapentin 300 mg p.o. 3 times daily for symptom relief.  Consider MRI and epidural steroid injection if symptoms worsen

## 2018-05-27 NOTE — Addendum Note (Signed)
Addended by: Shary Decamp B on: 05/27/2018 04:22 PM   Modules accepted: Orders

## 2018-05-29 ENCOUNTER — Ambulatory Visit
Admission: RE | Admit: 2018-05-29 | Discharge: 2018-05-29 | Disposition: A | Discharge status: Home / Self Care | Payer: Medicare Other | Source: Ambulatory Visit | Attending: Family Medicine | Admitting: Family Medicine

## 2018-05-29 DIAGNOSIS — M5412 Radiculopathy, cervical region: Secondary | ICD-10-CM

## 2018-06-09 ENCOUNTER — Other Ambulatory Visit: Payer: Self-pay | Admitting: Family Medicine

## 2018-06-10 ENCOUNTER — Telehealth: Payer: Self-pay | Admitting: Family Medicine

## 2018-06-10 ENCOUNTER — Other Ambulatory Visit: Payer: Self-pay | Admitting: Family Medicine

## 2018-06-10 DIAGNOSIS — M5412 Radiculopathy, cervical region: Secondary | ICD-10-CM

## 2018-06-10 NOTE — Telephone Encounter (Signed)
Pt called in regarding her arm pain that she seen dr. Dennard Schaumann for recently per his OV note we would schedule an MRI and steroid injs. Her pain has gotten significantly worse.

## 2018-06-10 NOTE — Telephone Encounter (Signed)
Please order and schedule an MRI of the cervical spine for cervical radiculopathy.

## 2018-06-17 ENCOUNTER — Ambulatory Visit
Admission: RE | Admit: 2018-06-17 | Discharge: 2018-06-17 | Disposition: A | Discharge status: Home / Self Care | Payer: Medicare Other | Source: Ambulatory Visit | Attending: Family Medicine | Admitting: Family Medicine

## 2018-06-17 DIAGNOSIS — M5412 Radiculopathy, cervical region: Secondary | ICD-10-CM

## 2018-06-19 ENCOUNTER — Other Ambulatory Visit: Payer: Self-pay | Admitting: Family Medicine

## 2018-06-19 DIAGNOSIS — M5412 Radiculopathy, cervical region: Secondary | ICD-10-CM

## 2018-06-20 ENCOUNTER — Telehealth: Payer: Self-pay | Admitting: Family Medicine

## 2018-06-20 NOTE — Telephone Encounter (Signed)
Patient called in states her arm is starting to hurt worse and would like something for pain called into [facility] in [place]. She states that the MRI showed something that might cause pain in her arm. She doesn't want a narcotic just something for pain.   CB#[phone]

## 2018-06-21 ENCOUNTER — Other Ambulatory Visit: Payer: Self-pay | Admitting: Family Medicine

## 2018-06-21 MED ORDER — TRAMADOL HCL 50 MG PO TABS: 50.0000 mg | ORAL_TABLET | Freq: Four times a day (QID) | ORAL | 0 refills | Status: DC | PRN

## 2018-06-21 MED ORDER — MELOXICAM 15 MG PO TABS: 15.0000 mg | ORAL_TABLET | Freq: Every day | ORAL | 2 refills | Status: DC

## 2018-06-21 NOTE — Telephone Encounter (Signed)
Ok, I ll send it in.

## 2018-06-21 NOTE — Telephone Encounter (Signed)
Spoke with patient and informed her that Meloxicam was sent in to the pharmacy. Patient verbalized understanding.

## 2018-06-21 NOTE — Telephone Encounter (Signed)
Patient stated that she is not taking the meloxicam currently due to running out months ago. She would like to try the meloxicam instead of trying tramadol.

## 2018-06-21 NOTE — Telephone Encounter (Signed)
She is on gabapentin and meloxicam?  The next option is a pain killer (opiate).  She could try tramadol if she wants a mild pain killer.  I will send tramadol to help her until she can see ortho for possible cortisone shot in neck.

## 2018-06-22 ENCOUNTER — Other Ambulatory Visit: Payer: Self-pay | Admitting: *Deleted

## 2018-06-22 DIAGNOSIS — M5412 Radiculopathy, cervical region: Secondary | ICD-10-CM

## 2018-06-22 DIAGNOSIS — M47812 Spondylosis without myelopathy or radiculopathy, cervical region: Secondary | ICD-10-CM

## 2018-08-19 ENCOUNTER — Other Ambulatory Visit: Payer: Self-pay | Admitting: Family Medicine

## 2018-08-19 MED ORDER — TRAMADOL HCL 50 MG PO TABS: 50.0000 mg | ORAL_TABLET | Freq: Four times a day (QID) | ORAL | 0 refills | Status: DC | PRN

## 2018-08-19 NOTE — Telephone Encounter (Signed)
Patient would like refill on her Toradol if possible [facility] [place]

## 2018-08-19 NOTE — Telephone Encounter (Signed)
Requesting refill    Tramadol  LOV: 05/27/18  LRF:  06/21/18

## 2018-09-17 ENCOUNTER — Other Ambulatory Visit (HOSPITAL_COMMUNITY): Payer: Self-pay | Admitting: Family Medicine

## 2018-09-17 DIAGNOSIS — Z1231 Encounter for screening mammogram for malignant neoplasm of breast: Secondary | ICD-10-CM

## 2018-09-30 ENCOUNTER — Ambulatory Visit (HOSPITAL_COMMUNITY)
Admission: RE | Admit: 2018-09-30 | Discharge: 2018-09-30 | Disposition: A | Discharge status: Home / Self Care | Payer: Medicare Other | Source: Ambulatory Visit | Attending: Family Medicine | Admitting: Family Medicine

## 2018-09-30 DIAGNOSIS — Z1231 Encounter for screening mammogram for malignant neoplasm of breast: Secondary | ICD-10-CM | POA: Insufficient documentation

## 2018-10-25 ENCOUNTER — Other Ambulatory Visit: Payer: Self-pay | Admitting: Family Medicine

## 2018-10-25 MED ORDER — SIMVASTATIN 40 MG PO TABS: 40.0000 mg | ORAL_TABLET | Freq: Every day | ORAL | 1 refills | Status: DC

## 2018-12-02 ENCOUNTER — Other Ambulatory Visit: Payer: Self-pay | Admitting: Family Medicine

## 2018-12-31 ENCOUNTER — Encounter: Payer: Self-pay | Admitting: Family Medicine

## 2019-01-12 ENCOUNTER — Other Ambulatory Visit: Payer: Self-pay | Admitting: Family Medicine

## 2019-04-04 ENCOUNTER — Other Ambulatory Visit: Payer: Self-pay

## 2019-04-04 ENCOUNTER — Ambulatory Visit (INDEPENDENT_AMBULATORY_CARE_PROVIDER_SITE_OTHER): Payer: Medicare Other

## 2019-04-04 DIAGNOSIS — Z23 Encounter for immunization: Secondary | ICD-10-CM

## 2019-04-04 NOTE — Progress Notes (Signed)
Patient came in today to receive her annual flu vaccine. Patient was given fluad high dose flu injection in the right deltoid. Patient tolerated well. VIS given.

## 2019-04-21 ENCOUNTER — Other Ambulatory Visit: Payer: Self-pay | Admitting: Family Medicine

## 2019-05-01 ENCOUNTER — Other Ambulatory Visit: Payer: Self-pay

## 2019-05-01 DIAGNOSIS — Z20822 Contact with and (suspected) exposure to covid-19: Secondary | ICD-10-CM

## 2019-05-03 LAB — NOVEL CORONAVIRUS, NAA: SARS-CoV-2, NAA: NOT DETECTED

## 2019-06-03 ENCOUNTER — Other Ambulatory Visit: Payer: Self-pay | Admitting: Family Medicine

## 2019-06-11 ENCOUNTER — Other Ambulatory Visit: Payer: Self-pay

## 2019-06-11 DIAGNOSIS — Z20822 Contact with and (suspected) exposure to covid-19: Secondary | ICD-10-CM

## 2019-06-12 LAB — NOVEL CORONAVIRUS, NAA: SARS-CoV-2, NAA: NOT DETECTED

## 2019-08-02 ENCOUNTER — Other Ambulatory Visit: Payer: Self-pay

## 2019-08-02 ENCOUNTER — Ambulatory Visit
Admission: EM | Admit: 2019-08-02 | Discharge: 2019-08-02 | Disposition: A | Discharge status: Home / Self Care | Payer: Medicare Other | Attending: Emergency Medicine | Admitting: Emergency Medicine

## 2019-08-02 DIAGNOSIS — H9201 Otalgia, right ear: Secondary | ICD-10-CM | POA: Diagnosis not present

## 2019-08-02 DIAGNOSIS — T161XXA Foreign body in right ear, initial encounter: Secondary | ICD-10-CM

## 2019-08-02 MED ORDER — NEOMYCIN-POLYMYXIN-HC 3.5-10000-1 OT SUSP: 4.0000 [drp] | Freq: Three times a day (TID) | OTIC | 0 refills | Status: AC

## 2019-08-02 NOTE — Discharge Instructions (Signed)
Unable to remove foreign body in office from RT ear Ear drops prescribed to help with inflammation and prevent infection Continue to use OTC ibuprofen and/ or tylenol as needed for pain control Follow up with PCP or ENT on Monday for further evaluation and management Return here or go to the ER if you have any new or worsening symptoms such as fever, chills, nausea, vomiting, severe pain, changes in hearing, blood in ear, etc..Marland Kitchen

## 2019-08-02 NOTE — ED Triage Notes (Signed)
Pt presents to UC w/ c/o right ear obstruction. Pt states a piece of her hearing aid got stuck in her ear canal almost 2 weeks ago. Pt has impaired hearing in right ear. Pt states it has started to cause sharp pain.

## 2019-08-02 NOTE — ED Provider Notes (Signed)
[place]   PX:[id number] 08/02/19 Arrival Time: [id number]  CC: EAR PAIN  SUBJECTIVE: History from: patient.  Taylor Holmes is a 68 y.o. female who presents with of RT ear FB x 2 weeks.  Symptoms began after a piece of her hearing aid broke off in her ear.  Patient states the pain is intermittent and sharp in character.  Patient has tried removing at home, and oil without relief.  Symptoms are made worse with lying down.  Denies similar symptoms in the past.    Denies fever, chills, fatigue, sinus pain, rhinorrhea, ear discharge, sore throat, SOB, wheezing, chest pain, nausea, changes in bowel or bladder habits.    ROS: As per HPI.  All other pertinent ROS negative.     Past Medical History:  Diagnosis Date  . Allergy   . Hyperlipidemia   . Hypertension    Past Surgical History:  Procedure Laterality Date  . ABDOMINAL HYSTERECTOMY     No Known Allergies No current facility-administered medications on file prior to encounter.   Current Outpatient Medications on File Prior to Encounter  Medication Sig Dispense Refill  . cetirizine (ZYRTEC) 10 MG tablet Take 10 mg by mouth daily as needed for allergies.    . citalopram (CELEXA) 20 MG tablet TAKE 1 TABLET BY MOUTH EVERY DAY 90 tablet 3  . gabapentin (NEURONTIN) 300 MG capsule TAKE 1 CAPSULE BY MOUTH THREE TIMES A DAY 270 capsule 2  . hydrochlorothiazide (HYDRODIURIL) 25 MG tablet TAKE 1 TABLET BY MOUTH EVERY DAY 90 tablet 3  . losartan (COZAAR) 100 MG tablet TAKE 1 TABLET BY MOUTH EVERY DAY 90 tablet 3  . Melatonin 10 MG TABS Take by mouth.    . meloxicam (MOBIC) 15 MG tablet TAKE 1 TABLET BY MOUTH EVERY DAY 30 tablet 2  . omeprazole (PRILOSEC) 40 MG capsule TAKE 1 CAPSULE (40 MG TOTAL) BY MOUTH DAILY. 30 capsule 11  . simvastatin (ZOCOR) 40 MG tablet TAKE 1 TABLET BY MOUTH EVERYDAY AT BEDTIME 90 tablet 1  . traMADol (ULTRAM) 50 MG tablet Take 1 tablet (50 mg total) by mouth every 6 (six) hours as needed. 30 tablet 0  .  TRAVATAN Z 0.004 % SOLN ophthalmic solution INSTILL 1DROP IN EACH AFFECTED EYE ONCE A DAY (IN THE EVENING) FOR 30 DAYS  12  . vitamin B-12 (CYANOCOBALAMIN) 1000 MCG tablet Take 1,000 mcg by mouth daily.     Social History   Socioeconomic History  . Marital status: Married    Spouse name: Not on file  . Number of children: Not on file  . Years of education: Not on file  . Highest education level: Not on file  Occupational History  . Not on file  Tobacco Use  . Smoking status: Never Smoker  . Smokeless tobacco: Never Used  Substance and Sexual Activity  . Alcohol use: Yes    Alcohol/week: 0.0 standard drinks    Comment: "wine every now and again"  . Drug use: No  . Sexual activity: Yes  Other Topics Concern  . Not on file  Social History Narrative  . Not on file   Social Determinants of Health   Financial Resource Strain:   . Difficulty of Paying Living Expenses: Not on file  Food Insecurity:   . Worried About Charity fundraiser in the Last Year: Not on file  . Ran Out of Food in the Last Year: Not on file  Transportation Needs:   . Lack of Transportation (  Medical): Not on file  . Lack of Transportation (Non-Medical): Not on file  Physical Activity:   . Days of Exercise per Week: Not on file  . Minutes of Exercise per Session: Not on file  Stress:   . Feeling of Stress : Not on file  Social Connections:   . Frequency of Communication with Friends and Family: Not on file  . Frequency of Social Gatherings with Friends and Family: Not on file  . Attends Religious Services: Not on file  . Active Member of Clubs or Organizations: Not on file  . Attends Archivist Meetings: Not on file  . Marital Status: Not on file  Intimate Partner Violence:   . Fear of Current or Ex-Partner: Not on file  . Emotionally Abused: Not on file  . Physically Abused: Not on file  . Sexually Abused: Not on file   Family History  Problem Relation Age of Onset  . Colon cancer  Father   . Colon cancer Brother   . Breast cancer Paternal Aunt     OBJECTIVE:  Vitals:   08/02/19 1255  BP: 129/78  Pulse: 75  Resp: 16  Temp: 98.3 F (36.8 C)  TempSrc: Oral  SpO2: 94%     General appearance: alert; appears uncomfortable, but nontoxic HEENT: NCAT; Ears: RT EAC with clear FB, TM appears intact and pearly gray; Eyes: EOMI grossly Neck: supple without LAD Lungs: unlabored respirations, normal respiratory effort Skin: warm and dry Psychological: alert and cooperative; normal mood and affect  PROCEDURE:  Consent granted.  Failed instrument removal of FB in right ear.  Right ear lavage performed by RN unable to remove FB.  PT tolerated procedure well.     ASSESSMENT & PLAN:  1. Foreign body of right ear, initial encounter     Meds ordered this encounter  Medications  . neomycin-polymyxin-hydrocortisone (CORTISPORIN) 3.5-10000-1 OTIC suspension    Sig: Place 4 drops into the right ear 3 (three) times daily for 10 days.    Dispense:  10 mL    Refill:  0    Order Specific Question:   Supervising Provider    Answer:   Raylene Everts [id number]    Unable to remove foreign body in office from RT ear Ear drops prescribed to help with inflammation and prevent infection Continue to use OTC ibuprofen and/ or tylenol as needed for pain control Follow up with PCP or ENT on Monday for further evaluation and management Return here or go to the ER if you have any new or worsening symptoms such as fever, chills, nausea, vomiting, severe pain, changes in hearing, blood in ear, etc...  Reviewed expectations re: course of current medical issues. Questions answered. Outlined signs and symptoms indicating need for more acute intervention. Patient verbalized understanding. After Visit Summary given.         Lestine Box, PA-C 08/02/19 1425

## 2019-08-03 ENCOUNTER — Encounter: Payer: Self-pay | Admitting: Family Medicine

## 2019-08-04 ENCOUNTER — Other Ambulatory Visit: Payer: Self-pay | Admitting: Family Medicine

## 2019-08-04 DIAGNOSIS — T161XXA Foreign body in right ear, initial encounter: Secondary | ICD-10-CM | POA: Insufficient documentation

## 2019-08-04 NOTE — Telephone Encounter (Signed)
Contacted patient to inquire.   Reports that she has appointment with ENT at 11am today.

## 2019-08-20 ENCOUNTER — Other Ambulatory Visit: Payer: Medicare Other

## 2019-09-23 ENCOUNTER — Other Ambulatory Visit: Payer: Self-pay | Admitting: Family Medicine

## 2019-11-13 ENCOUNTER — Other Ambulatory Visit (HOSPITAL_COMMUNITY): Payer: Self-pay | Admitting: Family Medicine

## 2019-11-13 DIAGNOSIS — Z1231 Encounter for screening mammogram for malignant neoplasm of breast: Secondary | ICD-10-CM

## 2019-11-17 DIAGNOSIS — H5202 Hypermetropia, left eye: Secondary | ICD-10-CM | POA: Diagnosis not present

## 2019-11-17 DIAGNOSIS — H5211 Myopia, right eye: Secondary | ICD-10-CM | POA: Diagnosis not present

## 2019-11-17 DIAGNOSIS — H401131 Primary open-angle glaucoma, bilateral, mild stage: Secondary | ICD-10-CM | POA: Diagnosis not present

## 2019-11-26 ENCOUNTER — Ambulatory Visit (HOSPITAL_COMMUNITY): Payer: Medicare PPO

## 2019-12-01 ENCOUNTER — Other Ambulatory Visit: Payer: Self-pay

## 2019-12-01 ENCOUNTER — Ambulatory Visit (HOSPITAL_COMMUNITY)
Admission: RE | Admit: 2019-12-01 | Discharge: 2019-12-01 | Disposition: A | Discharge status: Home / Self Care | Payer: Medicare PPO | Source: Ambulatory Visit | Attending: Family Medicine | Admitting: Family Medicine

## 2019-12-01 DIAGNOSIS — Z1231 Encounter for screening mammogram for malignant neoplasm of breast: Secondary | ICD-10-CM

## 2020-03-17 ENCOUNTER — Other Ambulatory Visit: Payer: Self-pay

## 2020-03-17 MED ORDER — HYDROCHLOROTHIAZIDE 25 MG PO TABS: 25.0000 mg | ORAL_TABLET | Freq: Every day | ORAL | 3 refills | Status: DC

## 2020-04-25 ENCOUNTER — Other Ambulatory Visit: Payer: Self-pay | Admitting: Family Medicine

## 2020-05-01 ENCOUNTER — Other Ambulatory Visit: Payer: Self-pay | Admitting: Family Medicine

## 2020-05-13 ENCOUNTER — Other Ambulatory Visit: Payer: Self-pay

## 2020-05-13 ENCOUNTER — Ambulatory Visit: Payer: Medicare Other | Admitting: Family Medicine

## 2020-05-13 ENCOUNTER — Other Ambulatory Visit: Payer: Self-pay | Admitting: Family Medicine

## 2020-05-13 VITALS — BP 130/60 | HR 76 | Temp 98.1°F | Wt 167.0 lb

## 2020-05-13 DIAGNOSIS — M47812 Spondylosis without myelopathy or radiculopathy, cervical region: Secondary | ICD-10-CM | POA: Diagnosis not present

## 2020-05-13 DIAGNOSIS — Z0000 Encounter for general adult medical examination without abnormal findings: Secondary | ICD-10-CM

## 2020-05-13 DIAGNOSIS — I10 Essential (primary) hypertension: Secondary | ICD-10-CM | POA: Diagnosis not present

## 2020-05-13 DIAGNOSIS — E7800 Pure hypercholesterolemia, unspecified: Secondary | ICD-10-CM

## 2020-05-13 DIAGNOSIS — Z780 Asymptomatic menopausal state: Secondary | ICD-10-CM

## 2020-05-13 DIAGNOSIS — R7309 Other abnormal glucose: Secondary | ICD-10-CM | POA: Diagnosis not present

## 2020-05-13 DIAGNOSIS — D539 Nutritional anemia, unspecified: Secondary | ICD-10-CM | POA: Diagnosis not present

## 2020-05-13 DIAGNOSIS — Z0001 Encounter for general adult medical examination with abnormal findings: Secondary | ICD-10-CM

## 2020-05-13 DIAGNOSIS — Z23 Encounter for immunization: Secondary | ICD-10-CM | POA: Diagnosis not present

## 2020-05-13 DIAGNOSIS — D559 Anemia due to enzyme disorder, unspecified: Secondary | ICD-10-CM | POA: Diagnosis not present

## 2020-05-13 MED ORDER — ESOMEPRAZOLE MAGNESIUM 20 MG PO CPDR: 20.0000 mg | DELAYED_RELEASE_CAPSULE | Freq: Every day | ORAL | 3 refills | Status: DC

## 2020-05-13 NOTE — Progress Notes (Signed)
Subjective:    Patient ID: Taylor Holmes, female    DOB: [birth year], 69 y.o.   MRN: [id number]  HPI Patient is a very pleasant 69 year old African-American female here today for complete physical exam.  Her mammogram was performed earlier this year in April and was completely normal.  She has a history of a total abdominal hysterectomy and therefore does not require a Pap smear.  Her colonoscopy was performed 4 years ago in 2017 but did reveal 2 polyps.  She is due for repeat colonoscopy next year.  She has never had a bone density test.  She denies any falls, depression, or memory loss.  Blood pressures well controlled at 130/60 she is due for a flu shot today.  She is also due for Prevnar 13. Immunization History  Administered Date(s) Administered   Fluad Quad(high Dose 65+) 04/04/2019   Influenza Split 05/07/2014, 05/31/2016   Influenza, High Dose Seasonal PF 06/02/2017, 05/27/2018   Influenza-Unspecified 05/22/2015   Pneumococcal Polysaccharide-23 06/28/2016   Tdap 05/18/2011    Past Medical History:  Diagnosis Date   Allergy    Hyperlipidemia    Hypertension    Past Surgical History:  Procedure Laterality Date   ABDOMINAL HYSTERECTOMY     ABDOMINAL HYSTERECTOMY N/A    [facility] 05/10/2020   EYE SURGERY N/A    [facility] 05/10/2020   Current Outpatient Medications on File Prior to Visit  Medication Sig Dispense Refill   cetirizine (ZYRTEC) 10 MG tablet Take 10 mg by mouth daily as needed for allergies.     citalopram (CELEXA) 20 MG tablet TAKE 1 TABLET BY MOUTH EVERY DAY 90 tablet 3   gabapentin (NEURONTIN) 300 MG capsule TAKE 1 CAPSULE BY MOUTH THREE TIMES A DAY 270 capsule 2   hydrochlorothiazide (HYDRODIURIL) 25 MG tablet Take 1 tablet (25 mg total) by mouth daily. 90 tablet 3   losartan (COZAAR) 100 MG tablet TAKE 1 TABLET BY MOUTH EVERY DAY 90 tablet 3   Melatonin 10 MG TABS Take by mouth.     meloxicam (MOBIC) 15 MG tablet TAKE 1 TABLET BY MOUTH  EVERY DAY 30 tablet 2   omeprazole (PRILOSEC) 40 MG capsule TAKE 1 CAPSULE (40 MG TOTAL) BY MOUTH DAILY. 30 capsule 11   simvastatin (ZOCOR) 40 MG tablet TAKE 1 TABLET BY MOUTH EVERYDAY AT BEDTIME - NEEDS OFFICE VISIT 90 tablet 1   TRAVATAN Z 0.004 % SOLN ophthalmic solution INSTILL 1DROP IN EACH AFFECTED EYE ONCE A DAY (IN THE EVENING) FOR 30 DAYS  12   vitamin B-12 (CYANOCOBALAMIN) 1000 MCG tablet Take 1,000 mcg by mouth daily.     traMADol (ULTRAM) 50 MG tablet Take 1 tablet (50 mg total) by mouth every 6 (six) hours as needed. (Patient not taking: Reported on 05/13/2020) 30 tablet 0   No current facility-administered medications on file prior to visit.   No Known Allergies Social History   Socioeconomic History   Marital status: Married    Spouse name: Not on file   Number of children: Not on file   Years of education: Not on file   Highest education level: Not on file  Occupational History   Not on file  Tobacco Use   Smoking status: Never Smoker   Smokeless tobacco: Never Used  Substance and Sexual Activity   Alcohol use: Yes    Alcohol/week: 0.0 standard drinks    Comment: "wine every now and again"   Drug use: No   Sexual activity: Yes  Other  Topics Concern   Not on file  Social History Narrative   Not on file   Social Determinants of Health   Financial Resource Strain:    Difficulty of Paying Living Expenses: Not on file  Food Insecurity:    Worried About [place] in the Last Year: Not on file   Ran Out of Food in the Last Year: Not on file  Transportation Needs:    Lack of Transportation (Medical): Not on file   Lack of Transportation (Non-Medical): Not on file  Physical Activity:    Days of Exercise per Week: Not on file   Minutes of Exercise per Session: Not on file  Stress:    Feeling of Stress : Not on file  Social Connections:    Frequency of Communication with Friends and Family: Not on file   Frequency of Social  Gatherings with Friends and Family: Not on file   Attends Religious Services: Not on file   Active Member of Clubs or Organizations: Not on file   Attends Archivist Meetings: Not on file   Marital Status: Not on file  Intimate Partner Violence:    Fear of Current or Ex-Partner: Not on file   Emotionally Abused: Not on file   Physically Abused: Not on file   Sexually Abused: Not on file    family history is significant for mother with dementia, a father with benign prostatic hypertrophy, a mother with seizure disorder.   Review of Systems  All other systems reviewed and are negative.      Objective:   Physical Exam Vitals reviewed.  Constitutional:      General: She is not in acute distress.    Appearance: She is well-developed. She is not diaphoretic.  HENT:     Head: Normocephalic and atraumatic.     Right Ear: External ear normal.     Left Ear: External ear normal.     Nose: Nose normal.     Mouth/Throat:     Pharynx: No oropharyngeal exudate.  Eyes:     General: No scleral icterus.       Right eye: No discharge.        Left eye: No discharge.     Conjunctiva/sclera: Conjunctivae normal.     Pupils: Pupils are equal, round, and reactive to light.  Neck:     Thyroid: No thyromegaly.     Vascular: No JVD.     Trachea: No tracheal deviation.  Cardiovascular:     Rate and Rhythm: Normal rate and regular rhythm.     Heart sounds: Normal heart sounds. No murmur heard.  No friction rub. No gallop.   Pulmonary:     Effort: Pulmonary effort is normal. No respiratory distress.     Breath sounds: Normal breath sounds. No stridor. No wheezing or rales.  Chest:     Chest wall: No tenderness.  Abdominal:     General: Bowel sounds are normal. There is no distension.     Palpations: Abdomen is soft. There is no mass.     Tenderness: There is no abdominal tenderness. There is no guarding or rebound.  Musculoskeletal:        General: No tenderness. Normal  range of motion.     Cervical back: Normal range of motion and neck supple.  Lymphadenopathy:     Cervical: No cervical adenopathy.  Skin:    General: Skin is warm.     Coloration: Skin is not pale.  Findings: No erythema or rash.  Neurological:     Mental Status: She is alert and oriented to person, place, and time.     Cranial Nerves: No cranial nerve deficit.     Motor: No abnormal muscle tone.     Coordination: Coordination normal.     Deep Tendon Reflexes: Reflexes are normal and symmetric.  Psychiatric:        Behavior: Behavior normal.        Thought Content: Thought content normal.        Judgment: Judgment normal.           Assessment & Plan:  Postmenopausal estrogen deficiency - Plan: DG Bone Density  Benign essential HTN - Plan: CBC with Differential/Platelet, COMPLETE METABOLIC PANEL WITH GFR, Lipid panel  General medical exam  Pure hypercholesterolemia  Facet arthritis of cervical region  Patient's physical exam is normal.  Blood pressure is excellent.  Patient received Prevnar 13 today along with her flu shot.  The remainder of her immunizations are up-to-date although she is due for her second Shingrix shot at her earliest convenience.  Mammogram is up-to-date.  Colonoscopy is due next year.  Schedule the patient for a bone density test.  Patient denies any falls, depression, or memory loss.  Recommended 1200 mg a day of calcium and 1000 units a day of vitamin D.

## 2020-05-13 NOTE — Addendum Note (Signed)
Addended by: Saundra Shelling on: 05/13/2020 11:50 AM   Modules accepted: Orders

## 2020-05-15 LAB — LIPID PANEL
Cholesterol: 183 mg/dL (ref <200)
HDL: 51 mg/dL (ref >=50)
LDL Cholesterol (Calc): 116 mg/dL (calc) — ABNORMAL HIGH
Non-HDL Cholesterol (Calc): 132 mg/dL (calc) — ABNORMAL HIGH (ref <130)
Total CHOL/HDL Ratio: 3.6 (calc) (ref <5.0)
Triglycerides: 67 mg/dL (ref <150)

## 2020-05-15 LAB — IRON, TOTAL/TOTAL IRON BINDING CAP
%SAT: 12 % (calc) — ABNORMAL LOW (ref 16–45)
Iron: 56 ug/dL (ref 45–160)
TIBC: 476 mcg/dL (calc) — ABNORMAL HIGH (ref 250–450)

## 2020-05-15 LAB — COMPLETE METABOLIC PANEL WITH GFR
AG Ratio: 1.4 (calc) (ref 1.0–2.5)
ALT: 16 U/L (ref 6–29)
AST: 16 U/L (ref 10–35)
Albumin: 4 g/dL (ref 3.6–5.1)
Alkaline phosphatase (APISO): 67 U/L (ref 37–153)
BUN: 20 mg/dL (ref 7–25)
CO2: 25 mmol/L (ref 20–32)
Calcium: 9.2 mg/dL (ref 8.6–10.4)
Chloride: 105 mmol/L (ref 98–110)
Creat: 0.79 mg/dL (ref 0.60–0.93)
GFR, Est African American: 88 mL/min/{1.73_m2} (ref >=60)
GFR, Est Non African American: 76 mL/min/{1.73_m2} (ref >=60)
Globulin: 2.8 g/dL (calc) (ref 1.9–3.7)
Glucose, Bld: 112 mg/dL — ABNORMAL HIGH (ref 65–99)
Potassium: 3.7 mmol/L (ref 3.5–5.3)
Sodium: 140 mmol/L (ref 135–146)
Total Bilirubin: 0.3 mg/dL (ref 0.2–1.2)
Total Protein: 6.8 g/dL (ref 6.1–8.1)

## 2020-05-15 LAB — CBC WITH DIFFERENTIAL/PLATELET
Absolute Monocytes: 581 cells/uL (ref 200–950)
Basophils Absolute: 17 cells/uL (ref 0–200)
Basophils Relative: 0.2 %
Eosinophils Absolute: 100 cells/uL (ref 15–500)
Eosinophils Relative: 1.2 %
HCT: 37 % (ref 35.0–45.0)
Hemoglobin: 11.3 g/dL — ABNORMAL LOW (ref 11.7–15.5)
Lymphs Abs: 2523 cells/uL (ref 850–3900)
MCH: 26.8 pg — ABNORMAL LOW (ref 27.0–33.0)
MCHC: 30.5 g/dL — ABNORMAL LOW (ref 32.0–36.0)
MCV: 87.7 fL (ref 80.0–100.0)
MPV: 10.2 fL (ref 7.5–12.5)
Monocytes Relative: 7 %
Neutro Abs: 5080 cells/uL (ref 1500–7800)
Neutrophils Relative %: 61.2 %
Platelets: 264 10*3/uL (ref 140–400)
RBC: 4.22 10*6/uL (ref 3.80–5.10)
RDW: 14.4 % (ref 11.0–15.0)
Total Lymphocyte: 30.4 %
WBC: 8.3 10*3/uL (ref 3.8–10.8)

## 2020-05-15 LAB — TEST AUTHORIZATION

## 2020-05-15 LAB — VITAMIN B12: Vitamin B-12: >2000 pg/mL — ABNORMAL HIGH (ref 200–1100)

## 2020-05-15 LAB — HEMOGLOBIN A1C W/OUT EAG: Hgb A1c MFr Bld: 5.7 % of total Hgb — ABNORMAL HIGH (ref <5.7)

## 2020-05-17 ENCOUNTER — Other Ambulatory Visit: Payer: Self-pay

## 2020-05-21 DIAGNOSIS — H401131 Primary open-angle glaucoma, bilateral, mild stage: Secondary | ICD-10-CM | POA: Diagnosis not present

## 2020-06-03 ENCOUNTER — Ambulatory Visit: Payer: Medicare PPO | Attending: Internal Medicine

## 2020-06-03 DIAGNOSIS — Z23 Encounter for immunization: Secondary | ICD-10-CM

## 2020-06-03 NOTE — Progress Notes (Signed)
   [facility]ination [facility]  Name:  Taylor Holmes    MRN: [id number] DOB: 1951-07-04  06/03/2020  Taylor Holmes was observed post Covid-19 immunization for 15 minutes without incident. She was provided with Vaccine Information Sheet and instruction to access the V-Safe system.   Taylor Holmes was instructed to call 911 with any severe reactions post vaccine: Marland Kitchen Difficulty breathing  . Swelling of face and throat  . A fast heartbeat  . A bad rash all over body  . Dizziness and weakness

## 2020-06-08 ENCOUNTER — Other Ambulatory Visit: Payer: Self-pay | Admitting: Family Medicine

## 2020-06-16 DIAGNOSIS — M79604 Pain in right leg: Secondary | ICD-10-CM | POA: Diagnosis not present

## 2020-06-16 DIAGNOSIS — M79605 Pain in left leg: Secondary | ICD-10-CM | POA: Diagnosis not present

## 2020-06-16 DIAGNOSIS — I83813 Varicose veins of bilateral lower extremities with pain: Secondary | ICD-10-CM | POA: Diagnosis not present

## 2020-07-08 ENCOUNTER — Encounter: Payer: Self-pay | Admitting: Family Medicine

## 2020-07-21 DIAGNOSIS — I83813 Varicose veins of bilateral lower extremities with pain: Secondary | ICD-10-CM | POA: Diagnosis not present

## 2020-07-28 DIAGNOSIS — I83813 Varicose veins of bilateral lower extremities with pain: Secondary | ICD-10-CM | POA: Diagnosis not present

## 2020-07-28 DIAGNOSIS — I83893 Varicose veins of bilateral lower extremities with other complications: Secondary | ICD-10-CM | POA: Diagnosis not present

## 2020-08-07 ENCOUNTER — Other Ambulatory Visit: Payer: Self-pay | Admitting: Family Medicine

## 2020-08-27 ENCOUNTER — Telehealth: Payer: Self-pay | Admitting: Family Medicine

## 2020-08-27 MED ORDER — LOSARTAN POTASSIUM 100 MG PO TABS: 100.0000 mg | ORAL_TABLET | Freq: Every day | ORAL | 3 refills | Status: DC

## 2020-08-27 MED ORDER — HYDROCHLOROTHIAZIDE 25 MG PO TABS: 25.0000 mg | ORAL_TABLET | Freq: Every day | ORAL | 3 refills | Status: DC

## 2020-08-27 MED ORDER — SIMVASTATIN 40 MG PO TABS: ORAL_TABLET | ORAL | 1 refills | Status: DC

## 2020-08-27 MED ORDER — MELOXICAM 15 MG PO TABS: 15.0000 mg | ORAL_TABLET | Freq: Every day | ORAL | 2 refills | Status: DC

## 2020-08-27 MED ORDER — ESOMEPRAZOLE MAGNESIUM 20 MG PO CPDR: 20.0000 mg | DELAYED_RELEASE_CAPSULE | Freq: Every day | ORAL | 3 refills | Status: DC

## 2020-08-27 MED ORDER — CITALOPRAM HYDROBROMIDE 20 MG PO TABS: 20.0000 mg | ORAL_TABLET | Freq: Every day | ORAL | 3 refills | Status: DC

## 2020-08-27 NOTE — Telephone Encounter (Signed)
Prescription sent to pharmacy.

## 2020-08-27 NOTE — Telephone Encounter (Signed)
Received fax from [facility] asking for a refill on her simvastatin (ZOCOR) 40 MG tablet , meloxicam (MOBIC) 15 MG tablet , and losartan (COZAAR) 100 MG tablet  ,

## 2020-09-20 DIAGNOSIS — I83813 Varicose veins of bilateral lower extremities with pain: Secondary | ICD-10-CM | POA: Diagnosis not present

## 2020-09-20 DIAGNOSIS — I83893 Varicose veins of bilateral lower extremities with other complications: Secondary | ICD-10-CM | POA: Diagnosis not present

## 2020-10-18 DIAGNOSIS — I83812 Varicose veins of left lower extremities with pain: Secondary | ICD-10-CM | POA: Diagnosis not present

## 2020-10-18 DIAGNOSIS — I8312 Varicose veins of left lower extremity with inflammation: Secondary | ICD-10-CM | POA: Diagnosis not present

## 2020-10-20 DIAGNOSIS — I8312 Varicose veins of left lower extremity with inflammation: Secondary | ICD-10-CM | POA: Diagnosis not present

## 2020-10-28 DIAGNOSIS — I83892 Varicose veins of left lower extremities with other complications: Secondary | ICD-10-CM | POA: Diagnosis not present

## 2020-10-28 DIAGNOSIS — I8312 Varicose veins of left lower extremity with inflammation: Secondary | ICD-10-CM | POA: Diagnosis not present

## 2020-11-01 DIAGNOSIS — I8312 Varicose veins of left lower extremity with inflammation: Secondary | ICD-10-CM | POA: Diagnosis not present

## 2020-11-03 ENCOUNTER — Other Ambulatory Visit: Payer: Self-pay | Admitting: Family Medicine

## 2020-11-15 ENCOUNTER — Other Ambulatory Visit (HOSPITAL_COMMUNITY): Payer: Medicare PPO

## 2020-11-18 ENCOUNTER — Inpatient Hospital Stay (HOSPITAL_COMMUNITY): Admission: RE | Admit: 2020-11-18 | Payer: Medicare HMO | Source: Ambulatory Visit

## 2020-11-18 ENCOUNTER — Other Ambulatory Visit (HOSPITAL_COMMUNITY): Payer: Self-pay | Admitting: Family Medicine

## 2020-11-18 DIAGNOSIS — Z1231 Encounter for screening mammogram for malignant neoplasm of breast: Secondary | ICD-10-CM

## 2020-12-02 ENCOUNTER — Other Ambulatory Visit: Payer: Self-pay

## 2020-12-02 ENCOUNTER — Ambulatory Visit (HOSPITAL_COMMUNITY)
Admission: RE | Admit: 2020-12-02 | Discharge: 2020-12-02 | Disposition: A | Discharge status: Home / Self Care | Payer: Medicare HMO | Source: Ambulatory Visit | Attending: Family Medicine | Admitting: Family Medicine

## 2020-12-02 DIAGNOSIS — Z1231 Encounter for screening mammogram for malignant neoplasm of breast: Secondary | ICD-10-CM

## 2020-12-02 DIAGNOSIS — Z780 Asymptomatic menopausal state: Secondary | ICD-10-CM | POA: Diagnosis present

## 2020-12-03 ENCOUNTER — Encounter: Payer: Self-pay | Admitting: *Deleted

## 2021-04-04 ENCOUNTER — Other Ambulatory Visit: Payer: Self-pay | Admitting: Family Medicine

## 2021-04-28 ENCOUNTER — Encounter: Payer: Self-pay | Admitting: Family Medicine

## 2021-04-28 ENCOUNTER — Ambulatory Visit (INDEPENDENT_AMBULATORY_CARE_PROVIDER_SITE_OTHER): Payer: Medicare HMO | Admitting: Family Medicine

## 2021-04-28 ENCOUNTER — Other Ambulatory Visit: Payer: Self-pay

## 2021-04-28 VITALS — BP 124/64 | HR 68 | Temp 98.0°F | Resp 16 | Ht 62.0 in | Wt 172.0 lb

## 2021-04-28 DIAGNOSIS — Z23 Encounter for immunization: Secondary | ICD-10-CM | POA: Diagnosis not present

## 2021-04-28 DIAGNOSIS — R42 Dizziness and giddiness: Secondary | ICD-10-CM | POA: Diagnosis not present

## 2021-04-28 DIAGNOSIS — R270 Ataxia, unspecified: Secondary | ICD-10-CM | POA: Diagnosis not present

## 2021-04-28 LAB — CBC WITH DIFFERENTIAL/PLATELET
Absolute Monocytes: 560 cells/uL (ref 200–950)
Basophils Absolute: 16 cells/uL (ref 0–200)
Basophils Relative: 0.2 %
Eosinophils Absolute: 136 cells/uL (ref 15–500)
Eosinophils Relative: 1.7 %
HCT: 39.1 % (ref 35.0–45.0)
Hemoglobin: 12.6 g/dL (ref 11.7–15.5)
Lymphs Abs: 2952 cells/uL (ref 850–3900)
MCH: 29.4 pg (ref 27.0–33.0)
MCHC: 32.2 g/dL (ref 32.0–36.0)
MCV: 91.1 fL (ref 80.0–100.0)
MPV: 10.4 fL (ref 7.5–12.5)
Monocytes Relative: 7 %
Neutro Abs: 4336 cells/uL (ref 1500–7800)
Neutrophils Relative %: 54.2 %
Platelets: 260 10*3/uL (ref 140–400)
RBC: 4.29 10*6/uL (ref 3.80–5.10)
RDW: 13.1 % (ref 11.0–15.0)
Total Lymphocyte: 36.9 %
WBC: 8 10*3/uL (ref 3.8–10.8)

## 2021-04-28 LAB — COMPLETE METABOLIC PANEL WITH GFR
AG Ratio: 1.4 (calc) (ref 1.0–2.5)
ALT: 18 U/L (ref 6–29)
AST: 17 U/L (ref 10–35)
Albumin: 4.1 g/dL (ref 3.6–5.1)
Alkaline phosphatase (APISO): 67 U/L (ref 37–153)
BUN: 16 mg/dL (ref 7–25)
CO2: 26 mmol/L (ref 20–32)
Calcium: 9.2 mg/dL (ref 8.6–10.4)
Chloride: 106 mmol/L (ref 98–110)
Creat: 0.91 mg/dL (ref 0.60–1.00)
Globulin: 2.9 g/dL (calc) (ref 1.9–3.7)
Glucose, Bld: 92 mg/dL (ref 65–99)
Potassium: 4.1 mmol/L (ref 3.5–5.3)
Sodium: 142 mmol/L (ref 135–146)
Total Bilirubin: 0.4 mg/dL (ref 0.2–1.2)
Total Protein: 7 g/dL (ref 6.1–8.1)
eGFR: 67 mL/min/{1.73_m2} (ref >=60)

## 2021-04-28 NOTE — Progress Notes (Signed)
Subjective:    Patient ID: Taylor Holmes, female    DOB: 30-Aug-1950, 70 y.o.   MRN: [id number]  HPI Patient is a very pleasant 70 year old African-American female with a history of hypertension and hyperlipidemia who presents today with disequilibrium.  She states that for the last month, she has felt like her coordination is "off".  She states that she is occasionally staggering at home.  She will lose her balance easily.  She denies any vertigo.  Dix-Hallpike maneuver is negative today.  She denies any room spinning with head turning or looking up or looking down or sitting up or standing up.  She denies any sudden hearing loss although she wears hearing aids at her baseline.  She denies any tinnitus beyond her baseline that is chronic.  She is having an occipital headache.  This has been mild and has been present for approximately 1 month.  It is located on the lower right occiput.  It is nonpulsatile.  She denies any facial droop, slurred speech, or unilateral weakness.  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  On exam today, cranial nerves II through XII are grossly intact with muscle strength 5/5 equal and symmetric in the upper and lower extremities.  However heel-to-toe walk is abnormal.  She is unable to maintain her balance with heel-to-toe walk and often has to reach her arms out to brace herself against the wall.  Romberg testing however is normal and finger-to-nose testing is normal.   Past Medical History:  Diagnosis Date   Allergy    Hyperlipidemia    Hypertension    Past Surgical History:  Procedure Laterality Date   ABDOMINAL HYSTERECTOMY     ABDOMINAL HYSTERECTOMY N/A    [facility] 05/10/2020   EYE SURGERY N/A    [facility] 05/10/2020   Current Outpatient Medications on File Prior to Visit  Medication Sig Dispense Refill   cetirizine (ZYRTEC) 10 MG tablet Take 10 mg by mouth daily as needed for  allergies.     citalopram (CELEXA) 20 MG tablet Take 1 tablet (20 mg total) by mouth daily. 90 tablet 3   esomeprazole (NEXIUM) 20 MG capsule Take 1 capsule (20 mg total) by mouth daily at 12 noon. 90 capsule 3   hydrochlorothiazide (HYDRODIURIL) 25 MG tablet Take 1 tablet (25 mg total) by mouth daily. 90 tablet 3   losartan (COZAAR) 100 MG tablet Take 1 tablet (100 mg total) by mouth daily. 90 tablet 3   Melatonin 10 MG TABS Take by mouth.     meloxicam (MOBIC) 15 MG tablet Take 1 tablet (15 mg total) by mouth daily. 90 tablet 2   simvastatin (ZOCOR) 40 MG tablet TAKE 1 TABLET EVERY DAY AT BEDTIME. NEED OFFICE VISIT 90 tablet 1   TRAVATAN Z 0.004 % SOLN ophthalmic solution INSTILL 1DROP IN EACH AFFECTED EYE ONCE A DAY (IN THE EVENING) FOR 30 DAYS  12   No current facility-administered medications on file prior to visit.   No Known Allergies Social History   Socioeconomic History   Marital status: Married    Spouse name: Not on file   Number of children: Not on file   Years of education: Not on file   Highest education level: Not on file  Occupational History   Not on file  Tobacco Use   Smoking status: Never   Smokeless tobacco: Never  Substance and Sexual Activity  Alcohol use: Yes    Alcohol/week: 0.0 standard drinks    Comment: "wine every now and again"   Drug use: No   Sexual activity: Yes  Other Topics Concern   Not on file  Social History Narrative   Not on file   Social Determinants of Health   Financial Resource Strain: Not on file  Food Insecurity: Not on file  Transportation Needs: Not on file  Physical Activity: Not on file  Stress: Not on file  Social Connections: Not on file  Intimate Partner Violence: Not on file    family history is significant for mother with dementia, a father with benign prostatic hypertrophy, a mother with seizure disorder.   Review of Systems  All other systems reviewed and are negative.     Objective:   Physical  Exam Vitals reviewed.  Constitutional:      General: She is not in acute distress.    Appearance: Normal appearance. She is well-developed and normal weight. She is not diaphoretic.  HENT:     Head: Normocephalic and atraumatic.     Right Ear: Tympanic membrane, ear canal and external ear normal.     Left Ear: Tympanic membrane, ear canal and external ear normal.     Nose: Nose normal.     Mouth/Throat:     Pharynx: No oropharyngeal exudate.  Eyes:     General: No visual field deficit or scleral icterus.       Right eye: No discharge.        Left eye: No discharge.     Conjunctiva/sclera: Conjunctivae normal.     Pupils: Pupils are equal, round, and reactive to light.  Neck:     Thyroid: No thyromegaly.     Vascular: No JVD.     Trachea: No tracheal deviation.  Cardiovascular:     Rate and Rhythm: Normal rate and regular rhythm.     Heart sounds: Normal heart sounds. No murmur heard.   No friction rub. No gallop.  Pulmonary:     Effort: Pulmonary effort is normal. No respiratory distress.     Breath sounds: Normal breath sounds. No stridor. No wheezing or rales.  Chest:     Chest wall: No tenderness.  Abdominal:     General: Bowel sounds are normal. There is no distension.     Palpations: Abdomen is soft. There is no mass.     Tenderness: There is no abdominal tenderness. There is no guarding or rebound.  Musculoskeletal:        General: No tenderness. Normal range of motion.     Cervical back: Normal range of motion and neck supple.  Lymphadenopathy:     Cervical: No cervical adenopathy.  Skin:    General: Skin is warm.     Coloration: Skin is not pale.     Findings: No erythema or rash.  Neurological:     Mental Status: She is alert and oriented to person, place, and time. Mental status is at baseline.     Cranial Nerves: Cranial nerves are intact. No cranial nerve deficit, dysarthria or facial asymmetry.     Sensory: Sensory deficit present.     Motor: Motor  function is intact. No tremor, atrophy, abnormal muscle tone or pronator drift.     Coordination: Romberg sign negative. Coordination normal. Heel to Shin Test abnormal. Finger-Nose-Finger Test normal.     Gait: Gait normal.     Deep Tendon Reflexes: Reflexes are normal and symmetric.  Psychiatric:  Behavior: Behavior normal.        Thought Content: Thought content normal.        Judgment: Judgment normal.          Assessment & Plan:  Dysequilibrium - Plan: CBC with Differential/Platelet, COMPLETE METABOLIC PANEL WITH GFR, MR Brain W Wo Contrast  Need for immunization against influenza - Plan: Flu Vaccine QUAD High Dose(Fluad)  Ataxia - Plan: MR Brain W Wo Contrast Patient is demonstrating ataxia.  Her heel-to-toe walk is abnormal and has changed grossly in just 1 month.  She is unable to maintain her balance when performing this test.  Otherwise her neurologic exam is normal and there is no obvious sign of a stroke on her exam.  Therefore I am worried about cerebellar degeneration.  Given her headache, however I am concerned about possible infarct in the cerebellum or even space-occupying lesion.  Therefore I feel that we need to get an MRI of the brain to evaluate further.  Also check a CBC to rule out anemia.  Blood pressure today is normal.

## 2021-05-05 ENCOUNTER — Encounter: Payer: Self-pay | Admitting: Family Medicine

## 2021-05-18 ENCOUNTER — Ambulatory Visit
Admission: RE | Admit: 2021-05-18 | Discharge: 2021-05-18 | Disposition: A | Discharge status: Home / Self Care | Payer: BC Managed Care – PPO | Source: Ambulatory Visit | Attending: Family Medicine | Admitting: Family Medicine

## 2021-05-18 ENCOUNTER — Other Ambulatory Visit: Payer: Self-pay

## 2021-05-18 DIAGNOSIS — R42 Dizziness and giddiness: Secondary | ICD-10-CM

## 2021-05-18 DIAGNOSIS — R270 Ataxia, unspecified: Secondary | ICD-10-CM

## 2021-05-18 MED ORDER — GADOBENATE DIMEGLUMINE 529 MG/ML IV SOLN
16.0000 mL | Freq: Once | INTRAVENOUS | Status: AC | PRN
  Administered 2021-05-18: 16 mL via INTRAVENOUS

## 2021-05-21 ENCOUNTER — Other Ambulatory Visit: Payer: Medicare HMO

## 2021-05-31 ENCOUNTER — Other Ambulatory Visit: Payer: Self-pay | Admitting: Family Medicine

## 2021-05-31 ENCOUNTER — Encounter: Payer: Medicare PPO | Admitting: Family Medicine

## 2021-06-01 ENCOUNTER — Other Ambulatory Visit: Payer: Self-pay | Admitting: Family Medicine

## 2021-07-14 ENCOUNTER — Telehealth: Payer: Self-pay | Admitting: Family Medicine

## 2021-07-14 NOTE — Telephone Encounter (Signed)
Contacted patient to try to schedule [facility] and she explained her mother passed away yesterday.  She asked that I send Dr Dennard Schaumann a note to let him know of her passing.

## 2021-08-05 ENCOUNTER — Other Ambulatory Visit: Payer: Self-pay | Admitting: Family Medicine

## 2021-08-11 ENCOUNTER — Other Ambulatory Visit: Payer: Self-pay

## 2021-08-11 ENCOUNTER — Ambulatory Visit (INDEPENDENT_AMBULATORY_CARE_PROVIDER_SITE_OTHER): Payer: Medicare PPO

## 2021-08-11 VITALS — Ht 61.0 in | Wt 167.0 lb

## 2021-08-11 DIAGNOSIS — Z1212 Encounter for screening for malignant neoplasm of rectum: Secondary | ICD-10-CM | POA: Diagnosis not present

## 2021-08-11 DIAGNOSIS — Z0000 Encounter for general adult medical examination without abnormal findings: Secondary | ICD-10-CM

## 2021-08-11 DIAGNOSIS — Z1211 Encounter for screening for malignant neoplasm of colon: Secondary | ICD-10-CM | POA: Diagnosis not present

## 2021-08-11 DIAGNOSIS — Z800 Family history of malignant neoplasm of digestive organs: Secondary | ICD-10-CM | POA: Insufficient documentation

## 2021-08-11 DIAGNOSIS — K219 Gastro-esophageal reflux disease without esophagitis: Secondary | ICD-10-CM | POA: Insufficient documentation

## 2021-08-11 NOTE — Patient Instructions (Signed)
Taylor Holmes , Thank you for taking time to come for your Medicare Wellness Visit. I appreciate your ongoing commitment to your health goals. Please review the following plan we discussed and let me know if I can assist you in the future.   Screening recommendations/referrals: Colonoscopy: Referral order placed today, 08/11/2021. Mammogram: Done 12/02/2020. Repeat annually  Bone Density: Done 12/02/2020. Repeat every 2 years  Recommended yearly ophthalmology/optometry visit for glaucoma screening and checkup Recommended yearly dental visit for hygiene and checkup  Vaccinations: Influenza vaccine: Done 04/28/2021. Repeat annually  Pneumococcal vaccine: Done 06/28/2016 and 05/13/2020 Tdap vaccine: Done 05/18/2011 Repeat in 10 years  Shingles vaccine: Done 02/12/2020 and 05/28/2020   Covid-19:Done 08/05/2019, 09/02/2019, 06/03/2020 and 03/07/2021.  Advanced directives: Advance directive discussed with you today. I have provided a copy for you to complete at home and have notarized. Once this is complete please bring a copy in to our office so we can scan it into your chart.   Conditions/risks identified: Aim for 30 minutes of exercise or brisk walking each day, drink 6-8 glasses of water and eat lots of fruits and vegetables.   Next appointment: Follow up in one year for your annual wellness visit 2024.   Preventive Care 72 Years and Older, Female Preventive care refers to lifestyle choices and visits with your health care provider that can promote health and wellness. What does preventive care include? A yearly physical exam. This is also called an annual well check. Dental exams once or twice a year. Routine eye exams. Ask your health care provider how often you should have your eyes checked. Personal lifestyle choices, including: Daily care of your teeth and gums. Regular physical activity. Eating a healthy diet. Avoiding tobacco and drug use. Limiting alcohol use. Practicing safe  sex. Taking low-dose aspirin every day. Taking vitamin and mineral supplements as recommended by your health care provider. What happens during an annual well check? The services and screenings done by your health care provider during your annual well check will depend on your age, overall health, lifestyle risk factors, and family history of disease. Counseling  Your health care provider may ask you questions about your: Alcohol use. Tobacco use. Drug use. Emotional well-being. Home and relationship well-being. Sexual activity. Eating habits. History of falls. Memory and ability to understand (cognition). Work and work Statistician. Reproductive health. Screening  You may have the following tests or measurements: Height, weight, and BMI. Blood pressure. Lipid and cholesterol levels. These may be checked every 5 years, or more frequently if you are over 30 years old. Skin check. Lung cancer screening. You may have this screening every year starting at age 72 if you have a 30-pack-year history of smoking and currently smoke or have quit within the past 15 years. Fecal occult blood test (FOBT) of the stool. You may have this test every year starting at age 72. Flexible sigmoidoscopy or colonoscopy. You may have a sigmoidoscopy every 5 years or a colonoscopy every 10 years starting at age 72. Hepatitis C blood test. Hepatitis B blood test. Sexually transmitted disease (STD) testing. Diabetes screening. This is done by checking your blood sugar (glucose) after you have not eaten for a while (fasting). You may have this done every 1-3 years. Bone density scan. This is done to screen for osteoporosis. You may have this done starting at age 72. Mammogram. This may be done every 1-2 years. Talk to your health care provider about how often you should have regular mammograms. Talk with  your health care provider about your test results, treatment options, and if necessary, the need for more  tests. Vaccines  Your health care provider may recommend certain vaccines, such as: Influenza vaccine. This is recommended every year. Tetanus, diphtheria, and acellular pertussis (Tdap, Td) vaccine. You may need a Td booster every 10 years. Zoster vaccine. You may need this after age 72. Pneumococcal 13-valent conjugate (PCV13) vaccine. One dose is recommended after age 72. Pneumococcal polysaccharide (PPSV23) vaccine. One dose is recommended after age 72. Talk to your health care provider about which screenings and vaccines you need and how often you need them. This information is not intended to replace advice given to you by your health care provider. Make sure you discuss any questions you have with your health care provider. Document Released: 08/20/2015 Document Revised: 04/12/2016 Document Reviewed: 05/25/2015 Elsevier Interactive Patient Education  2017 [place] Prevention in the Home Falls can cause injuries. They can happen to people of all ages. There are many things you can do to make your home safe and to help prevent falls. What can I do on the outside of my home? Regularly fix the edges of walkways and driveways and fix any cracks. Remove anything that might make you trip as you walk through a door, such as a raised step or threshold. Trim any bushes or trees on the path to your home. Use bright outdoor lighting. Clear any walking paths of anything that might make someone trip, such as rocks or tools. Regularly check to see if handrails are loose or broken. Make sure that both sides of any steps have handrails. Any raised decks and porches should have guardrails on the edges. Have any leaves, snow, or ice cleared regularly. Use sand or salt on walking paths during winter. Clean up any spills in your garage right away. This includes oil or grease spills. What can I do in the bathroom? Use night lights. Install grab bars by the toilet and in the tub and shower.  Do not use towel bars as grab bars. Use non-skid mats or decals in the tub or shower. If you need to sit down in the shower, use a plastic, non-slip stool. Keep the floor dry. Clean up any water that spills on the floor as soon as it happens. Remove soap buildup in the tub or shower regularly. Attach bath mats securely with double-sided non-slip rug tape. Do not have throw rugs and other things on the floor that can make you trip. What can I do in the bedroom? Use night lights. Make sure that you have a light by your bed that is easy to reach. Do not use any sheets or blankets that are too big for your bed. They should not hang down onto the floor. Have a firm chair that has side arms. You can use this for support while you get dressed. Do not have throw rugs and other things on the floor that can make you trip. What can I do in the kitchen? Clean up any spills right away. Avoid walking on wet floors. Keep items that you use a lot in easy-to-reach places. If you need to reach something above you, use a strong step stool that has a grab bar. Keep electrical cords out of the way. Do not use floor polish or wax that makes floors slippery. If you must use wax, use non-skid floor wax. Do not have throw rugs and other things on the floor that can make you trip.  What can I do with my stairs? Do not leave any items on the stairs. Make sure that there are handrails on both sides of the stairs and use them. Fix handrails that are broken or loose. Make sure that handrails are as long as the stairways. Check any carpeting to make sure that it is firmly attached to the stairs. Fix any carpet that is loose or worn. Avoid having throw rugs at the top or bottom of the stairs. If you do have throw rugs, attach them to the floor with carpet tape. Make sure that you have a light switch at the top of the stairs and the bottom of the stairs. If you do not have them, ask someone to add them for you. What else  can I do to help prevent falls? Wear shoes that: Do not have high heels. Have rubber bottoms. Are comfortable and fit you well. Are closed at the toe. Do not wear sandals. If you use a stepladder: Make sure that it is fully opened. Do not climb a closed stepladder. Make sure that both sides of the stepladder are locked into place. Ask someone to hold it for you, if possible. Clearly mark and make sure that you can see: Any grab bars or handrails. First and last steps. Where the edge of each step is. Use tools that help you move around (mobility aids) if they are needed. These include: Canes. Walkers. Scooters. Crutches. Turn on the lights when you go into a dark area. Replace any light bulbs as soon as they burn out. Set up your furniture so you have a clear path. Avoid moving your furniture around. If any of your floors are uneven, fix them. If there are any pets around you, be aware of where they are. Review your medicines with your doctor. Some medicines can make you feel dizzy. This can increase your chance of falling. Ask your doctor what other things that you can do to help prevent falls. This information is not intended to replace advice given to you by your health care provider. Make sure you discuss any questions you have with your health care provider. Document Released: 05/20/2009 Document Revised: 12/30/2015 Document Reviewed: 08/28/2014 Elsevier Interactive Patient Education  2017 [employer].

## 2021-08-11 NOTE — Progress Notes (Signed)
Subjective:   Taylor Holmes is a 71 y.o. female who presents for an Initial Medicare Annual Wellness Visit. Virtual Visit via Telephone Note  I connected with  Taylor Holmes on 08/11/21 at 12:00 PM EST by telephone and verified that I am speaking with the correct person using two identifiers.  Location: Patient: HOME Provider: [facility] Persons participating in the virtual visit: patient/Nurse Health Advisor   I discussed the limitations, risks, security and privacy concerns of performing an evaluation and management service by telephone and the availability of in person appointments. The patient expressed understanding and agreed to proceed.  Interactive audio and video telecommunications were attempted between this nurse and patient, however failed, due to patient having technical difficulties OR patient did not have access to video capability.  We continued and completed visit with audio only.  Some vital signs may be absent or patient reported.   Taylor Driver, LPN  Review of Systems     Cardiac Risk Factors include: advanced age (>64men, >74 women);hypertension;dyslipidemia;sedentary lifestyle;obesity (BMI >30kg/m2)  PHONE VISIT. PT AT HOME. NURSE AT [facility].    Objective:    Today's Vitals   08/11/21 1207  Weight: 167 lb (75.8 kg)  Height: 5\' 1"  (1.549 m)   Body mass index is 31.55 kg/m.  Advanced Directives 08/11/2021 11/26/2015  Does Patient Have a Medical Advance Directive? No No  Would patient like information on creating a medical advance directive? No - Patient declined No - patient declined information    Current Medications (verified) Outpatient Encounter Medications as of 08/11/2021  Medication Sig   cetirizine (ZYRTEC) 10 MG tablet Take 10 mg by mouth daily as needed for allergies.   citalopram (CELEXA) 20 MG tablet Take 1 tablet (20 mg total) by mouth daily.   esomeprazole (NEXIUM) 20 MG capsule TAKE 1 CAPSULE (20 MG TOTAL) BY MOUTH DAILY AT 12  NOON.   hydrochlorothiazide (HYDRODIURIL) 25 MG tablet Take 1 tablet (25 mg total) by mouth daily.   losartan (COZAAR) 100 MG tablet TAKE 1 TABLET EVERY DAY   Melatonin 10 MG TABS Take by mouth.   meloxicam (MOBIC) 15 MG tablet TAKE 1 TABLET EVERY DAY   simvastatin (ZOCOR) 40 MG tablet TAKE 1 TABLET EVERY DAY AT BEDTIME. NEED OFFICE VISIT   TRAVATAN Z 0.004 % SOLN ophthalmic solution INSTILL 1DROP IN EACH AFFECTED EYE ONCE A DAY (IN THE EVENING) FOR 30 DAYS   No facility-administered encounter medications on file as of 08/11/2021.    Allergies (verified) Patient has no known allergies.   History: Past Medical History:  Diagnosis Date   Allergy    Hyperlipidemia    Hypertension    Past Surgical History:  Procedure Laterality Date   ABDOMINAL HYSTERECTOMY     ABDOMINAL HYSTERECTOMY N/A    Phreesia 05/10/2020   EYE SURGERY N/A    Phreesia 05/10/2020   Family History  Problem Relation Age of Onset   Colon cancer Father    Colon cancer Brother    Breast cancer Paternal Aunt    Social History   Socioeconomic History   Marital status: Married    Spouse name: Barkley Bruns   Number of children: 2   Years of education: Not on file   Highest education level: Not on file  Occupational History   Not on file  Tobacco Use   Smoking status: Never   Smokeless tobacco: Never  Substance and Sexual Activity   Alcohol use: Yes    Alcohol/week: 0.0 standard  drinks    Comment: "wine every now and again"   Drug use: No   Sexual activity: Yes  Other Topics Concern   Not on file  Social History Narrative   2 sons.   3 grand children.   Social Determinants of Health   Financial Resource Strain: Low Risk    Difficulty of Paying Living Expenses: Not hard at all  Food Insecurity: No Food Insecurity   Worried About Charity fundraiser in the Last Year: Never true   [place] in the Last Year: Never true  Transportation Needs: No Transportation Needs   Lack of Transportation  (Medical): No   Lack of Transportation (Non-Medical): No  Physical Activity: Insufficiently Active   Days of Exercise per Week: 2 days   Minutes of Exercise per Session: 30 min  Stress: No Stress Concern Present   Feeling of Stress : Only a little  Social Connections: Engineer, building services of Communication with Friends and Family: More than three times a week   Frequency of Social Gatherings with Friends and Family: More than three times a week   Attends Religious Services: More than 4 times per year   Active Member of [employer] or Organizations: Yes   Attends Music therapist: More than 4 times per year   Marital Status: Married    Tobacco Counseling Counseling given: Not Answered   Clinical Intake:  Pre-visit preparation completed: Yes  Pain : No/denies pain     BMI - recorded: 31.55 Nutritional Status: BMI > 30  Obese Nutritional Risks: None  How often do you need to have someone help you when you read instructions, pamphlets, or other written materials from your doctor or pharmacy?: 1 - Never  Diabetic?NO  Interpreter Needed?: No  Information entered by :: Taylor Sermon, LPN   Activities of Daily Living In your present state of health, do you have any difficulty performing the following activities: 08/11/2021  Hearing? N  Vision? N  Difficulty concentrating or making decisions? N  Walking or climbing stairs? N  Dressing or bathing? N  Doing errands, shopping? N  Preparing Food and eating ? N  Using the Toilet? N  In the past six months, have you accidently leaked urine? N  Do you have problems with loss of bowel control? N  Managing your Medications? N  Managing your Finances? N  Housekeeping or managing your Housekeeping? N  Some recent data might be hidden    Patient Care Team: Susy Frizzle, MD as PCP - General (Family Medicine)  Indicate any recent Medical Services you may have received from other than [facility] providers in the past  year (date may be approximate).     Assessment:   This is a routine wellness examination for Taylor Holmes.  Hearing/Vision screen Hearing Screening - Comments:: Wears hearing aids. Vision Screening - Comments:: Glasses. [facility]. 2022.  Dietary issues and exercise activities discussed: Current Exercise Habits: Home exercise routine, Type of exercise: walking, Time (Minutes): 30, Frequency (Times/Week): 2, Weekly Exercise (Minutes/Week): 60, Intensity: Mild, Exercise limited by: cardiac condition(s)   Goals Addressed             This Visit's Progress    Exercise 3x per week (30 min per time)       Increase exercise. Plans to retire Oct 2023.       Depression Screen PHQ 2/9 Scores 08/11/2021 05/13/2020 08/16/2017  PHQ - 2 Score 1 0 0  Fall Risk Fall Risk  08/11/2021 05/13/2020 08/16/2017  Falls in the past year? 0 0 No  Number falls in past yr: 0 0 -  Injury with Fall? 0 0 -  Risk for fall due to : No Fall Risks - -  Follow up Falls prevention discussed - -    FALL RISK PREVENTION PERTAINING TO THE HOME:  Any stairs in or around the home? No  If so, are there any without handrails? No  Home free of loose throw rugs in walkways, pet beds, electrical cords, etc? Yes  Adequate lighting in your home to reduce risk of falls? Yes   ASSISTIVE DEVICES UTILIZED TO PREVENT FALLS:  Life alert? No  Use of a cane, walker or w/c? No  Grab bars in the bathroom? No  Shower chair or bench in shower? Yes  Elevated toilet seat or a handicapped toilet? No   TIMED UP AND GO:  Was the test performed? No .  Phone visit.  Cognitive Function:     6CIT Screen 08/11/2021 05/13/2020  What Year? 0 points 0 points  What month? 0 points 0 points  What time? 0 points 0 points  Count back from 20 0 points 0 points  Months in reverse 0 points 0 points  Repeat phrase 0 points 0 points  Total Score 0 0    Immunizations Immunization History  Administered Date(s) Administered    Fluad Quad(high Dose 65+) 04/04/2019, 05/13/2020, 04/28/2021   Influenza Split 05/07/2014, 05/31/2016   Influenza, High Dose Seasonal PF 06/02/2017, 05/27/2018   Influenza-Unspecified 05/22/2015   Moderna SARS-COV2 Booster Vaccination 06/03/2020   PFIZER(Purple Top)SARS-COV-2 Vaccination 08/05/2019, 09/02/2019, 06/03/2020, 03/07/2021   Pneumococcal Conjugate-13 05/13/2020   Pneumococcal Polysaccharide-23 06/28/2016   Tdap 05/18/2011   Zoster Recombinat (Shingrix) 02/12/2020, 05/28/2020    TDAP status: Due, Education has been provided regarding the importance of this vaccine. Advised may receive this vaccine at local pharmacy or Health Dept. Aware to provide a copy of the vaccination record if obtained from local pharmacy or Health Dept. Verbalized acceptance and understanding.  Flu Vaccine status: Up to date  Pneumococcal vaccine status: Up to date  Covid-19 vaccine status: Completed vaccines  Qualifies for Shingles Vaccine? Yes   Zostavax completed Yes   Shingrix Completed?: Yes  Screening Tests Health Maintenance  Topic Date Due   COLONOSCOPY (Pts 45-21yrs Insurance coverage will need to be confirmed)  12/02/2020   COVID-19 Vaccine (5 - Booster for Pfizer series) 05/02/2021   TETANUS/TDAP  05/17/2021   MAMMOGRAM  12/03/2022   Pneumonia Vaccine 17+ Years old  Completed   INFLUENZA VACCINE  Completed   DEXA SCAN  Completed   Hepatitis C Screening  Completed   Zoster Vaccines- Shingrix  Completed   HPV VACCINES  Aged Out    Health Maintenance  Health Maintenance Due  Topic Date Due   COLONOSCOPY (Pts 45-52yrs Insurance coverage will need to be confirmed)  12/02/2020   COVID-19 Vaccine (5 - Booster for [place] series) 05/02/2021   TETANUS/TDAP  05/17/2021    Colorectal cancer screening: Type of screening: Colonoscopy. Completed 12/03/2015. Repeat every 5 years  Mammogram status: Completed 12/02/2020. Repeat every year  Bone Density status: Completed 12/02/2020. Results  reflect: Bone density results: OSTEOPENIA. Repeat every 2 years.  Lung Cancer Screening: (Low Dose CT Chest recommended if Age 30-80 years, 30 pack-year currently smoking OR have quit w/in 15years.) does not qualify.    Additional Screening:  Hepatitis C Screening: does qualify; Completed 06/26/2017  Vision  Screening: Recommended annual ophthalmology exams for early detection of glaucoma and other disorders of the eye. Is the patient up to date with their annual eye exam?  Yes  Who is the provider or what is the name of the office in which the patient attends annual eye exams? [facility]  If pt is not established with a provider, would they like to be referred to a provider to establish care? No .   Dental Screening: Recommended annual dental exams for proper oral hygiene  Community Resource Referral / Chronic Care Management: CRR required this visit?  No   CCM required this visit?  No      Plan:     I have personally reviewed and noted the following in the patients chart:   Medical and social history Use of alcohol, tobacco or illicit drugs  Current medications and supplements including opioid prescriptions. Patient is not currently taking opioid prescriptions. Functional ability and status Nutritional status Physical activity Advanced directives List of other physicians Hospitalizations, surgeries, and ER visits in previous 12 months Vitals Screenings to include cognitive, depression, and falls Referrals and appointments  In addition, I have reviewed and discussed with patient certain preventive protocols, quality metrics, and best practice recommendations. A written personalized care plan for preventive services as well as general preventive health recommendations were provided to patient.     Taylor Driver, LPN   10/13/1827   Nurse Notes: Pt is due for colonoscopy. Pt advised and discussed referral. Pt states she would like a referral. Order placed  and pt aware. Discussed Tdap vaccine and how to obtain.

## 2021-09-01 ENCOUNTER — Encounter: Payer: Self-pay | Admitting: Gastroenterology

## 2021-09-20 DIAGNOSIS — H409 Unspecified glaucoma: Secondary | ICD-10-CM | POA: Diagnosis not present

## 2021-09-20 DIAGNOSIS — Z800 Family history of malignant neoplasm of digestive organs: Secondary | ICD-10-CM | POA: Diagnosis not present

## 2021-09-20 DIAGNOSIS — K219 Gastro-esophageal reflux disease without esophagitis: Secondary | ICD-10-CM | POA: Diagnosis not present

## 2021-09-20 DIAGNOSIS — I10 Essential (primary) hypertension: Secondary | ICD-10-CM | POA: Diagnosis not present

## 2021-09-20 DIAGNOSIS — Z20822 Contact with and (suspected) exposure to covid-19: Secondary | ICD-10-CM | POA: Diagnosis not present

## 2021-09-20 DIAGNOSIS — G8929 Other chronic pain: Secondary | ICD-10-CM | POA: Diagnosis not present

## 2021-09-20 DIAGNOSIS — F419 Anxiety disorder, unspecified: Secondary | ICD-10-CM | POA: Diagnosis not present

## 2021-09-20 DIAGNOSIS — Z791 Long term (current) use of non-steroidal anti-inflammatories (NSAID): Secondary | ICD-10-CM | POA: Diagnosis not present

## 2021-09-20 DIAGNOSIS — E785 Hyperlipidemia, unspecified: Secondary | ICD-10-CM | POA: Diagnosis not present

## 2021-10-03 ENCOUNTER — Encounter: Payer: Self-pay | Admitting: Family Medicine

## 2021-10-04 IMAGING — MG MM DIGITAL SCREENING BILAT W/ TOMO AND CAD
6 of 10 series · 6 of 30 positions shown · non-contrast
Comparison: Previous exam(s).

CLINICAL DATA: Screening.

EXAM:
DIGITAL SCREENING BILATERAL MAMMOGRAM WITH TOMOSYNTHESIS AND CAD
TECHNIQUE: Bilateral screening digital craniocaudal and mediolateral oblique
mammograms were obtained. Bilateral screening digital breast
tomosynthesis was performed. The images were evaluated with
computer-aided detection.

[R CC synth-2D]
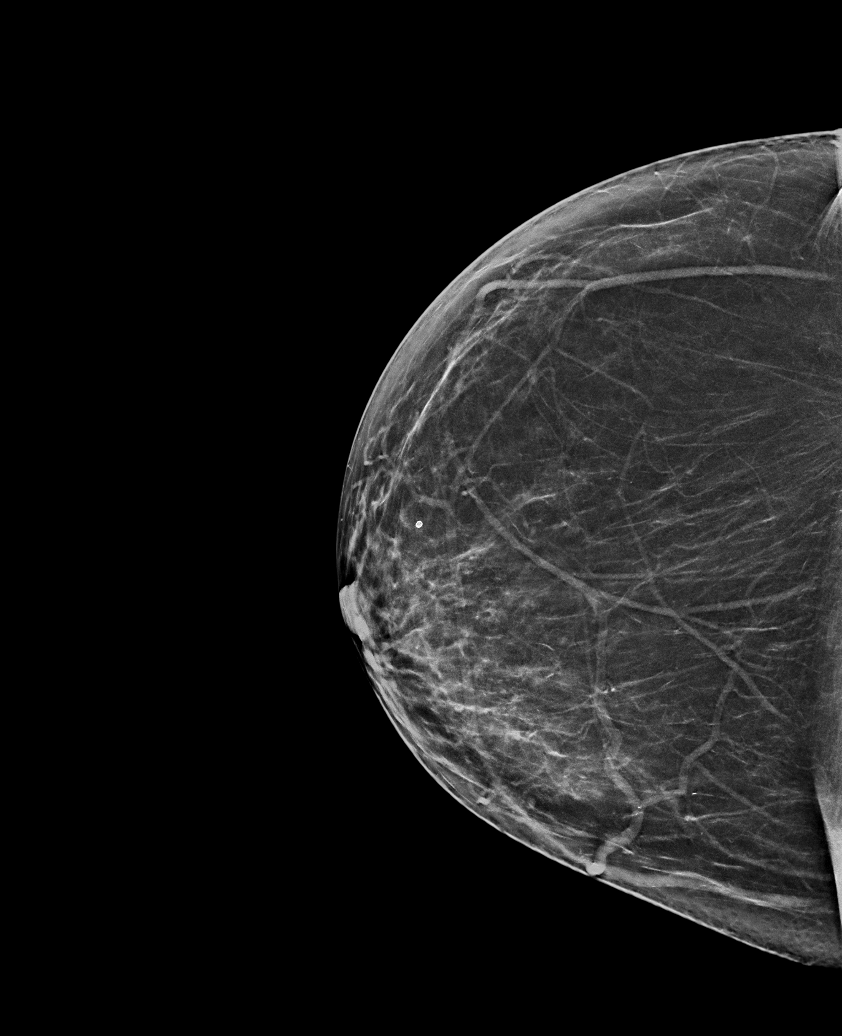

[L MLO synth-2D (1 of 2)]
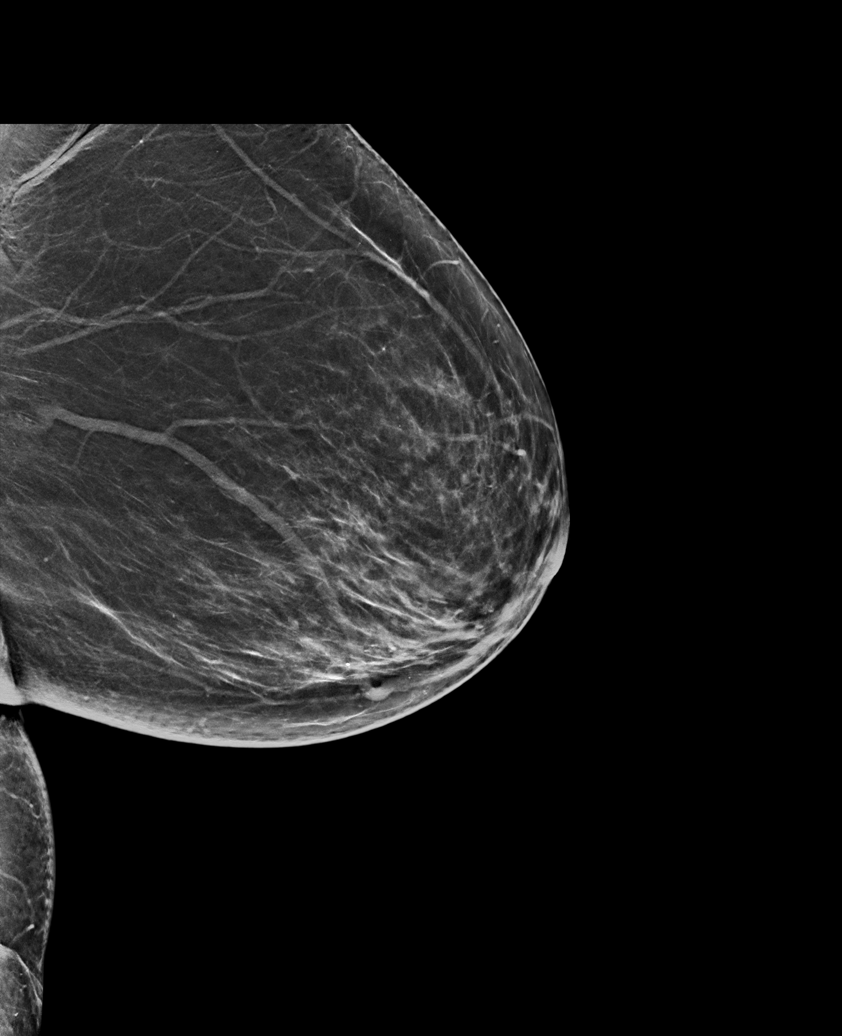

[L MLO synth-2D (2 of 2)]
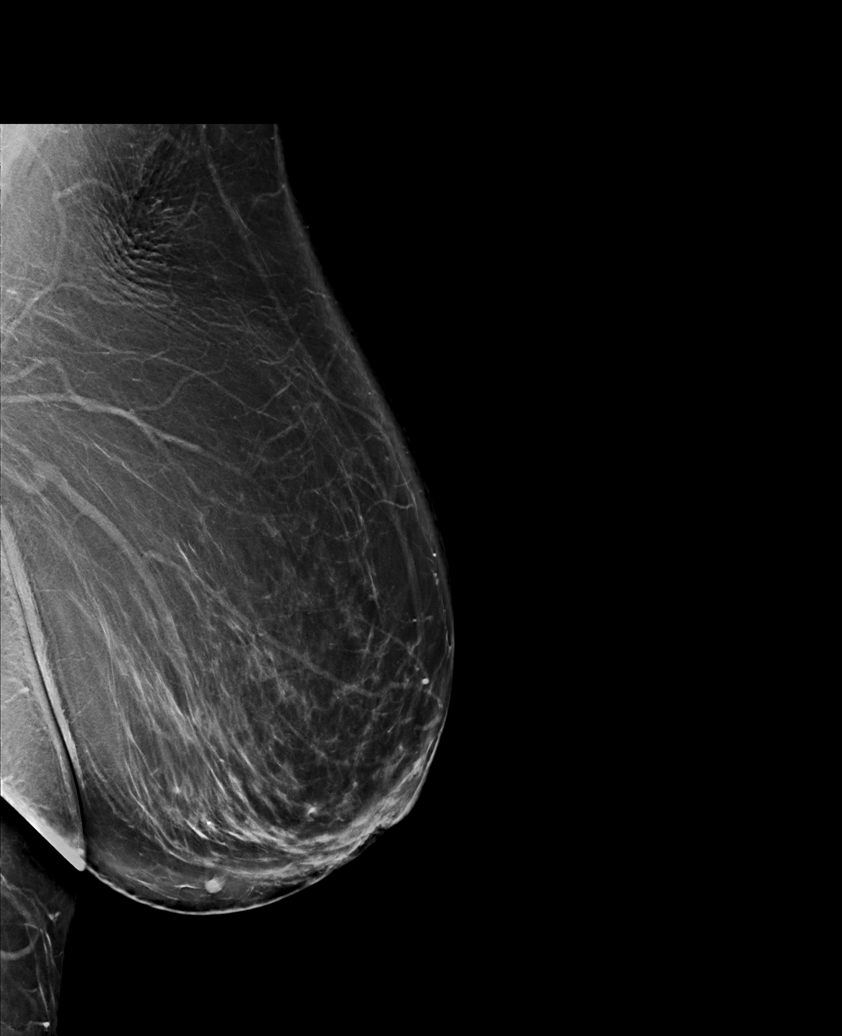

[L CC synth-2D]
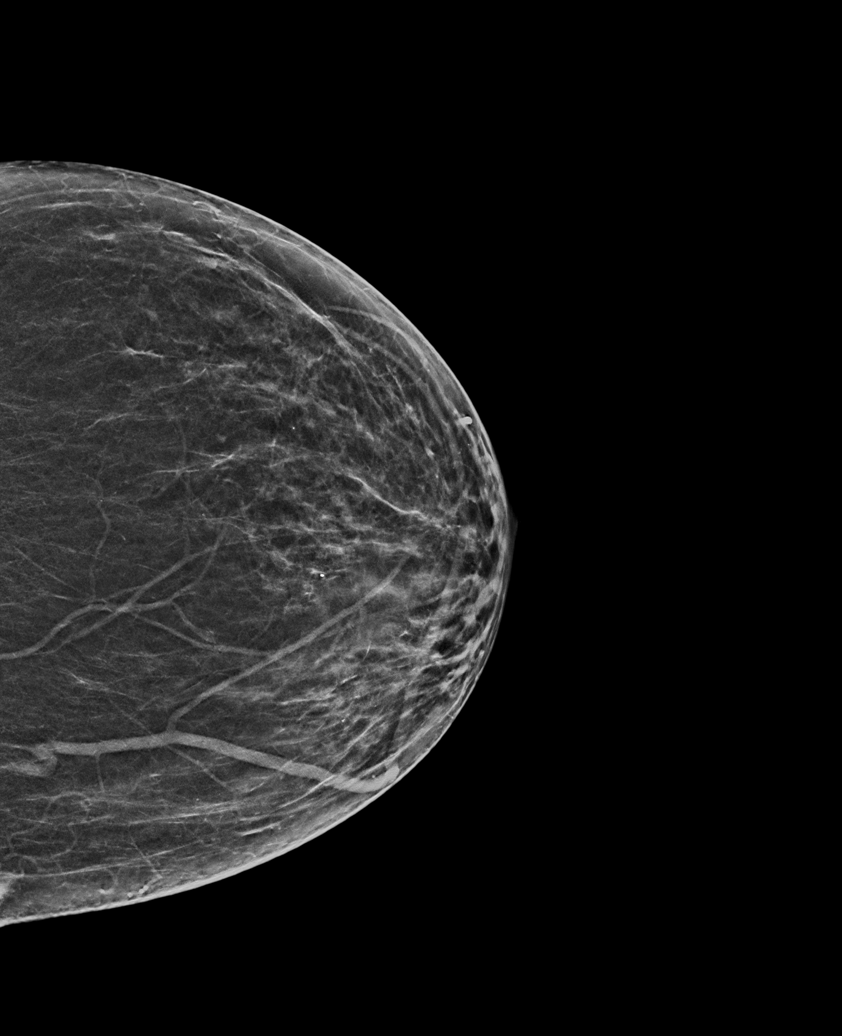

[R MLO synth-2D]
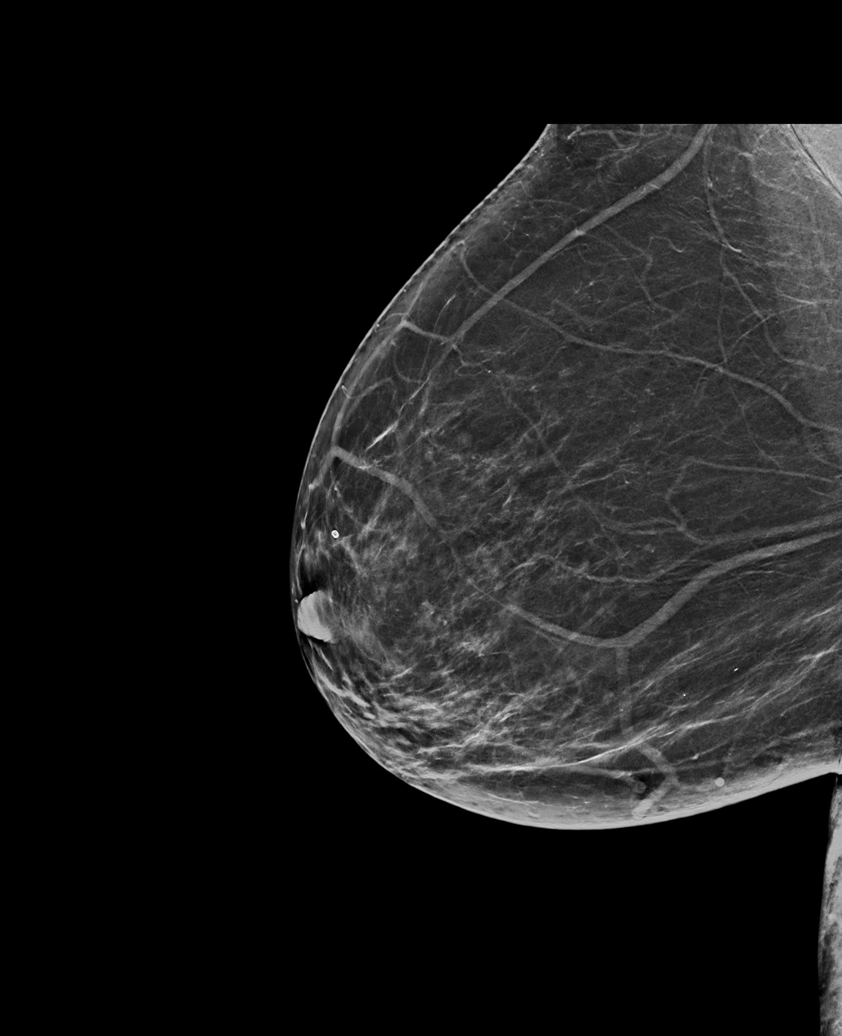

[L MLO tomo · tomo slice 36/71.0]
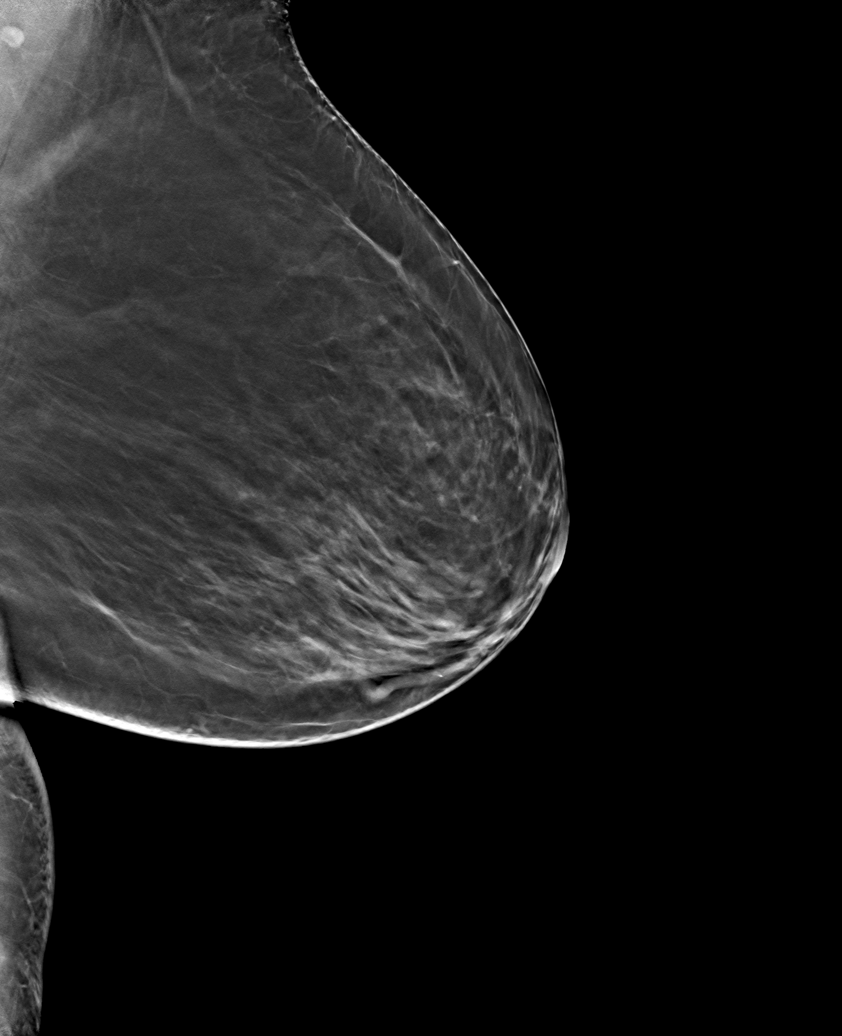

[6 of 30 positions shown; findings below may reference images not displayed]

ACR Breast Density Category b: There are scattered areas of
fibroglandular density.
FINDINGS: There are no findings suspicious for malignancy. The images were
evaluated with computer-aided detection.
IMPRESSION: No mammographic evidence of malignancy. A result letter of this
screening mammogram will be mailed directly to the patient.

RECOMMENDATION:
Screening mammogram in one year. (Code:[id number])

BI-RADS CATEGORY  1: Negative.

## 2021-10-10 ENCOUNTER — Other Ambulatory Visit: Payer: Self-pay

## 2021-10-10 ENCOUNTER — Ambulatory Visit (AMBULATORY_SURGERY_CENTER): Payer: Medicare PPO | Admitting: *Deleted

## 2021-10-10 VITALS — Ht 61.5 in | Wt 169.0 lb

## 2021-10-10 DIAGNOSIS — Z86010 Personal history of colonic polyps: Secondary | ICD-10-CM

## 2021-10-10 MED ORDER — PEG 3350-KCL-NA BICARB-NACL 420 G PO SOLR: 4000.0000 mL | Freq: Once | ORAL | 0 refills | Status: AC

## 2021-10-10 NOTE — Progress Notes (Signed)

## 2021-10-12 ENCOUNTER — Encounter: Payer: Self-pay | Admitting: Gastroenterology

## 2021-10-17 ENCOUNTER — Encounter: Payer: Self-pay | Admitting: Certified Registered Nurse Anesthetist

## 2021-10-21 ENCOUNTER — Other Ambulatory Visit: Payer: Self-pay | Admitting: Family Medicine

## 2021-10-24 ENCOUNTER — Ambulatory Visit (AMBULATORY_SURGERY_CENTER): Payer: Medicare PPO | Admitting: Gastroenterology

## 2021-10-24 ENCOUNTER — Encounter: Payer: Self-pay | Admitting: Gastroenterology

## 2021-10-24 VITALS — BP 129/62 | HR 74 | Temp 97.1°F | Resp 14 | Ht 62.0 in | Wt 169.0 lb

## 2021-10-24 DIAGNOSIS — I10 Essential (primary) hypertension: Secondary | ICD-10-CM | POA: Diagnosis not present

## 2021-10-24 DIAGNOSIS — Z86010 Personal history of colonic polyps: Secondary | ICD-10-CM | POA: Diagnosis not present

## 2021-10-24 DIAGNOSIS — Z800 Family history of malignant neoplasm of digestive organs: Secondary | ICD-10-CM

## 2021-10-24 MED ORDER — SODIUM CHLORIDE 0.9 % IV SOLN: 500.0000 mL | INTRAVENOUS | Status: DC

## 2021-10-24 NOTE — Progress Notes (Signed)
Report given to PACU, vss 

## 2021-10-24 NOTE — Progress Notes (Deleted)
Pt's states no medical or surgical changes since previsit or office visit. 

## 2021-10-24 NOTE — Progress Notes (Signed)
? ?History & Physical ? ?Primary Care Physician:  Susy Frizzle, MD ?Primary Gastroenterologist: Lucio Edward, MD ? ?CHIEF COMPLAINT:  Personal history of colon polyps  ? ?HPI: Taylor Holmes is a 71 y.o. female with a personal history of adenomatous colon polyps for surveillance colonoscopy. ? ? ?Past Medical History:  ?Diagnosis Date  ? Allergy   ? Hyperlipidemia   ? Hypertension   ? ? ?Past Surgical History:  ?Procedure Laterality Date  ? ABDOMINAL HYSTERECTOMY    ? ABDOMINAL HYSTERECTOMY N/A   ? Phreesia 05/10/2020  ? EYE SURGERY N/A   ? Phreesia 05/10/2020  ? ? ?Prior to Admission medications   ?Medication Sig Start Date End Date Taking? Authorizing Provider  ?cetirizine (ZYRTEC) 10 MG tablet Take 10 mg by mouth daily as needed for allergies.   Yes [provider]  ?citalopram (CELEXA) 20 MG tablet TAKE 1 TABLET EVERY DAY 10/21/21  Yes Susy Frizzle, MD  ?esomeprazole (NEXIUM) 20 MG capsule TAKE 1 CAPSULE (20 MG TOTAL) BY MOUTH DAILY AT 12 NOON. 06/01/21  Yes Susy Frizzle, MD  ?hydrochlorothiazide (HYDRODIURIL) 25 MG tablet Take 1 tablet (25 mg total) by mouth daily. 08/27/20  Yes Susy Frizzle, MD  ?losartan (COZAAR) 100 MG tablet TAKE 1 TABLET EVERY DAY 05/31/21  Yes Susy Frizzle, MD  ?Melatonin 10 MG TABS Take by mouth.   Yes [provider]  ?meloxicam (MOBIC) 15 MG tablet TAKE 1 TABLET EVERY DAY 08/05/21  Yes Susy Frizzle, MD  ?simvastatin (ZOCOR) 40 MG tablet TAKE 1 TABLET EVERY DAY AT BEDTIME. NEED OFFICE VISIT 04/06/21  Yes Susy Frizzle, MD  ?TRAVATAN Z 0.004 % SOLN ophthalmic solution INSTILL 1DROP IN [facility] AFFECTED EYE ONCE A DAY (IN THE EVENING) FOR 30 DAYS 05/18/15  Yes [provider]  ? ? ?Current Outpatient Medications  ?Medication Sig Dispense Refill  ? cetirizine (ZYRTEC) 10 MG tablet Take 10 mg by mouth daily as needed for allergies.    ? citalopram (CELEXA) 20 MG tablet TAKE 1 TABLET EVERY DAY 90 tablet 3  ? esomeprazole (NEXIUM) 20  MG capsule TAKE 1 CAPSULE (20 MG TOTAL) BY MOUTH DAILY AT 12 NOON. 90 capsule 3  ? hydrochlorothiazide (HYDRODIURIL) 25 MG tablet Take 1 tablet (25 mg total) by mouth daily. 90 tablet 3  ? losartan (COZAAR) 100 MG tablet TAKE 1 TABLET EVERY DAY 90 tablet 3  ? Melatonin 10 MG TABS Take by mouth.    ? meloxicam (MOBIC) 15 MG tablet TAKE 1 TABLET EVERY DAY 90 tablet 2  ? simvastatin (ZOCOR) 40 MG tablet TAKE 1 TABLET EVERY DAY AT BEDTIME. NEED OFFICE VISIT 90 tablet 1  ? TRAVATAN Z 0.004 % SOLN ophthalmic solution INSTILL 1DROP IN EACH AFFECTED EYE ONCE A DAY (IN THE EVENING) FOR 30 DAYS  12  ? ?Current Facility-Administered Medications  ?Medication Dose Route Frequency Provider Last Rate Last Admin  ? 0.9 %  sodium chloride infusion  500 mL Intravenous Continuous Ladene Artist, MD      ? ? ?Allergies as of 10/24/2021  ? (No Known Allergies)  ? ? ?Family History  ?Problem Relation Age of Onset  ? Colon cancer Father   ? Colon cancer Brother   ? Breast cancer Paternal Aunt   ? Esophageal cancer Neg Hx   ? Stomach cancer Neg Hx   ? Rectal cancer Neg Hx   ? ? ?Social History  ? ?Socioeconomic History  ? Marital status: Married  ?  Spouse name: Barkley Bruns  ? Number of children: 2  ? Years of education: Not on file  ? Highest education level: Not on file  ?Occupational History  ? Not on file  ?Tobacco Use  ? Smoking status: Never  ? Smokeless tobacco: Never  ?Vaping Use  ? Vaping Use: Never used  ?Substance and Sexual Activity  ? Alcohol use: Yes  ?  Alcohol/week: 0.0 standard drinks  ?  Comment: "wine every now and again"  ? Drug use: No  ? Sexual activity: Yes  ?Other Topics Concern  ? Not on file  ?Social History Narrative  ? 2 sons.  ? 3 grand children.  ? ?Social Determinants of Health  ? ?Financial Resource Strain: Low Risk   ? Difficulty of Paying Living Expenses: Not hard at all  ?Food Insecurity: No Food Insecurity  ? Worried About Charity fundraiser in the Last Year: Never true  ? Ran Out of Food in the Last Year:  Never true  ?Transportation Needs: No Transportation Needs  ? Lack of Transportation (Medical): No  ? Lack of Transportation (Non-Medical): No  ?Physical Activity: Insufficiently Active  ? Days of Exercise per Week: 2 days  ? Minutes of Exercise per Session: 30 min  ?Stress: No Stress Concern Present  ? Feeling of Stress : Only a little  ?Social Connections: Socially Integrated  ? Frequency of Communication with Friends and Family: More than three times a week  ? Frequency of Social Gatherings with Friends and Family: More than three times a week  ? Attends Religious Services: More than 4 times per year  ? Active Member of Clubs or Organizations: Yes  ? Attends Archivist Meetings: More than 4 times per year  ? Marital Status: Married  ?Intimate Partner Violence: Not At Risk  ? Fear of Current or Ex-Partner: No  ? Emotionally Abused: No  ? Physically Abused: No  ? Sexually Abused: No  ? ? ?Review of Systems: ? ?All systems reviewed an negative except where noted in HPI. ? ?Gen: Denies any fever, chills, sweats, anorexia, fatigue, weakness, malaise, weight loss, and sleep disorder ?CV: Denies chest pain, angina, palpitations, syncope, orthopnea, PND, peripheral edema, and claudication. ?Resp: Denies dyspnea at rest, dyspnea with exercise, cough, sputum, wheezing, coughing up blood, and pleurisy. ?GI: Denies vomiting blood, jaundice, and fecal incontinence.   Denies dysphagia or odynophagia. ?GU : Denies urinary burning, blood in urine, urinary frequency, urinary hesitancy, nocturnal urination, and urinary incontinence. ?MS: Denies joint pain, limitation of movement, and swelling, stiffness, low back pain, extremity pain. Denies muscle weakness, cramps, atrophy.  ?Derm: Denies rash, itching, dry skin, hives, moles, warts, or unhealing ulcers.  ?Psych: Denies depression, anxiety, memory loss, suicidal ideation, hallucinations, paranoia, and confusion. ?Heme: Denies bruising, bleeding, and enlarged lymph  nodes. ?Neuro:  Denies any headaches, dizziness, paresthesias. ?Endo:  Denies any problems with DM, thyroid, adrenal function. ? ? ?Physical Exam: ?General:  Alert, well-developed, in NAD ?Head:  Normocephalic and atraumatic. ?Eyes:  Sclera clear, no icterus.   Conjunctiva pink. ?Ears:  Normal auditory acuity. ?Mouth:  No deformity or lesions.  ?Neck:  Supple; no masses . ?Lungs:  Clear throughout to auscultation.   No wheezes, crackles, or rhonchi. No acute distress. ?Heart:  Regular rate and rhythm; no murmurs. ?Abdomen:  Soft, nondistended, nontender. No masses, hepatomegaly. No obvious masses.  Normal bowel .    ?Rectal:  Deferred   ?Msk:  Symmetrical without gross deformities.Marland Kitchen ?Pulses:  Normal pulses noted. ?Extremities:  Without edema. ?Neurologic:  Alert and  oriented x4;  grossly normal neurologically. ?Skin:  Intact without significant lesions or rashes. ?Cervical Nodes:  No significant cervical adenopathy. ?Psych:  Alert and cooperative. Normal mood and affect. ? ? ?Impression / Plan:  ? ?Personal history of adenomatous colon polyps for surveillance colonoscopy. ? ?In[place]wa Baris T. Fuller Plan  10/24/2021, 10:49 AM ?See Shea Evans, [facility], to contact our on call provider ? ? ?  ?

## 2021-10-24 NOTE — Op Note (Addendum)
[place] ?Patient Name: Taylor Holmes ?Procedure Date: 10/24/2021 10:44 AM ?MRN: [id number] ?Endoscopist: Ladene Artist , MD ?Age: 71 ?Referring MD:  ?Date of Birth: 1951-07-03 ?Gender: Female ?Account #: [id number] ?Procedure:                Colonoscopy ?Indications:              Surveillance: Personal history of adenomatous  ?                          polyps on last colonoscopy > 5 years ago, Family  ?                          history of colon cancer, first degree relative ?Medicines:                Monitored Anesthesia Care ?Procedure:                Pre-Anesthesia Assessment: ?                          - Prior to the procedure, a History and Physical  ?                          was performed, and patient medications and  ?                          allergies were reviewed. The patient's tolerance of  ?                          previous anesthesia was also reviewed. The risks  ?                          and benefits of the procedure and the sedation  ?                          options and risks were discussed with the patient.  ?                          All questions were answered, and informed consent  ?                          was obtained. Prior Anticoagulants: The patient has  ?                          taken no previous anticoagulant or antiplatelet  ?                          agents. ASA Grade Assessment: II - A patient with  ?                          mild systemic disease. After reviewing the risks  ?                          and benefits, the patient was deemed in  ?  satisfactory condition to undergo the procedure. ?                          After obtaining informed consent, the colonoscope  ?                          was passed under direct vision. Throughout the  ?                          procedure, the patient's blood pressure, pulse, and  ?                          oxygen saturations were monitored continuously. The  ?                          Colonoscope  was introduced through the anus and  ?                          advanced to the the cecum, identified by  ?                          appendiceal orifice and ileocecal valve. The  ?                          ileocecal valve, appendiceal orifice, and rectum  ?                          were photographed. The quality of the bowel  ?                          preparation was good. The colonoscopy was performed  ?                          without difficulty. The patient tolerated the  ?                          procedure well. ?Scope In: 10:52:38 AM ?Scope Out: 11:06:35 AM ?Scope Withdrawal Time: 0 hours 11 minutes 21 seconds  ?Total Procedure Duration: 0 hours 13 minutes 57 seconds  ?Findings:                 The perianal and digital rectal examinations were  ?                          normal. ?                          Multiple small-mouthed diverticula were found in  ?                          the left colon. There was narrowing of the colon in  ?                          association with the diverticular opening. There  ?  was evidence of diverticular spasm. There was no  ?                          evidence of diverticular bleeding. ?                          The exam was otherwise without abnormality on  ?                          direct and retroflexion views. ?Complications:            No immediate complications. Estimated blood loss:  ?                          None. ?Estimated Blood Loss:     Estimated blood loss: none. ?Impression:               - Moderate diverticulosis in the left colon. ?                          - The examination was otherwise normal on direct  ?                          and retroflexion views. ?                          - No specimens collected. ?Recommendation:           - Patient has a contact number available for  ?                          emergencies. The signs and symptoms of potential  ?                          delayed complications were discussed with the  ?                           patient. Return to normal activities tomorrow.  ?                          Written discharge instructions were provided to the  ?                          patient. ?                          - High fiber diet. ?                          - Continue present medications. ?                          - Consider repeat colonoscopy in 5 years for  ?                          surveillance vs no repeat due to age. ?Ladene Artist, MD ?10/24/2021 11:09:47 AM ?This report has been signed electronically. ?

## 2021-10-24 NOTE — Patient Instructions (Signed)
Handout on diverticulosis given to patient. ?Repeat colonoscopy in 5 years vs. Not due to age for surveillance purposes. ?Resume previous diet - high fiber diet recommended ?Continue present medications ? ? ?YOU HAD AN ENDOSCOPIC PROCEDURE TODAY AT [facility]:   Refer to the procedure report that was given to you for any specific questions about what was found during the examination.  If the procedure report does not answer your questions, please call your gastroenterologist to clarify.  If you requested that your care partner not be given the details of your procedure findings, then the procedure report has been included in a sealed envelope for you to review at your convenience later. ? ?YOU SHOULD EXPECT: Some feelings of bloating in the abdomen. Passage of more gas than usual.  Walking can help get rid of the air that was put into your GI tract during the procedure and reduce the bloating. If you had a lower endoscopy (such as a colonoscopy or flexible sigmoidoscopy) you may notice spotting of blood in your stool or on the toilet paper. If you underwent a bowel prep for your procedure, you may not have a normal bowel movement for a few days. ? ?Please Note:  You might notice some irritation and congestion in your nose or some drainage.  This is from the oxygen used during your procedure.  There is no need for concern and it should clear up in a day or so. ? ?SYMPTOMS TO REPORT IMMEDIATELY: ? ?Following lower endoscopy (colonoscopy or flexible sigmoidoscopy): ? Excessive amounts of blood in the stool ? Significant tenderness or worsening of abdominal pains ? Swelling of the abdomen that is new, acute ? Fever of 100?F or higher ? ?For urgent or emergent issues, a gastroenterologist can be reached at any hour by calling [phone]. ?Do not use MyChart messaging for urgent concerns.  ? ? ?DIET:  We do recommend a small meal at first, but then you may proceed to your regular diet.  Drink plenty  of fluids but you should avoid alcoholic beverages for 24 hours. ? ?ACTIVITY:  You should plan to take it easy for the rest of today and you should NOT DRIVE or use heavy machinery until tomorrow (because of the sedation medicines used during the test).   ? ?FOLLOW UP: ?Our staff will call the number listed on your records 48-72 hours following your procedure to check on you and address any questions or concerns that you may have regarding the information given to you following your procedure. If we do not reach you, we will leave a message.  We will attempt to reach you two times.  During this call, we will ask if you have developed any symptoms of COVID 19. If you develop any symptoms (ie: fever, flu-like symptoms, shortness of breath, cough etc.) before then, please call [phone].  If you test positive for Covid 19 in the 2 weeks post procedure, please call and report this information to [place].   ? ?If any biopsies were taken you will be contacted by phone or by letter within the next 1-3 weeks.  Please call us at [phone] if you have not heard about the biopsies in 3 weeks.  ? ? ?SIGNATURES/CONFIDENTIALITY: ?You and/or your care partner have signed paperwork which will be entered into your electronic medical record.  These signatures attest to the fact that that the information above on your After Visit Summary has been reviewed and is understood.  Full responsibility of  the confidentiality of this discharge information lies with you and/or your care-partner.  ?

## 2021-10-24 NOTE — Progress Notes (Signed)
Pt's states no medical or surgical changes since previsit or office visit. 

## 2021-10-26 ENCOUNTER — Telehealth: Payer: Self-pay

## 2021-10-26 NOTE — Telephone Encounter (Signed)
?  Follow up Call- ? ?Call back number 10/24/2021  ?Post procedure Call Back phone  # [phone]  ?Permission to leave phone message Yes  ?Some recent data might be hidden  ?  ? ?Patient questions: ? ?Do you have a fever, pain , or abdominal swelling? No. ?Pain Score  0 * ? ?Have you tolerated food without any problems? Yes.   ? ?Have you been able to return to your normal activities? Yes.   ? ?Do you have any questions about your discharge instructions: ?Diet   No. ?Medications  No. ?Follow up visit  No. ? ?Do you have questions or concerns about your Care? No. ? ?Actions: ?* If pain score is 4 or above: ?No action needed, pain <4. ? ?Have you developed a fever since your procedure? nono ? ?2.   Have you had an respiratory symptoms (SOB or cough) since your procedure? no ? ?3.   Have you tested positive for COVID 19 since your procedure no ? ?4.   Have you had any family members/close contacts diagnosed with the COVID 19 since your procedure?  no ? ? ?If yes to any of these questions please route to Joylene John, RN and Joella Prince, RN ? ?

## 2021-11-14 ENCOUNTER — Other Ambulatory Visit: Payer: Self-pay | Admitting: Family Medicine

## 2021-12-06 DIAGNOSIS — H2513 Age-related nuclear cataract, bilateral: Secondary | ICD-10-CM | POA: Diagnosis not present

## 2021-12-06 DIAGNOSIS — H524 Presbyopia: Secondary | ICD-10-CM | POA: Diagnosis not present

## 2021-12-06 DIAGNOSIS — H401131 Primary open-angle glaucoma, bilateral, mild stage: Secondary | ICD-10-CM | POA: Diagnosis not present

## 2021-12-20 ENCOUNTER — Ambulatory Visit: Payer: Medicare PPO | Admitting: Family Medicine

## 2021-12-20 VITALS — BP 130/70 | HR 75 | Temp 97.9°F | Ht 62.0 in | Wt 174.0 lb

## 2021-12-20 DIAGNOSIS — R0600 Dyspnea, unspecified: Secondary | ICD-10-CM

## 2021-12-20 MED ORDER — POTASSIUM CHLORIDE CRYS ER 20 MEQ PO TBCR: 20.0000 meq | EXTENDED_RELEASE_TABLET | Freq: Every day | ORAL | 3 refills | Status: DC

## 2021-12-20 MED ORDER — FUROSEMIDE 40 MG PO TABS: 40.0000 mg | ORAL_TABLET | Freq: Every day | ORAL | 3 refills | Status: DC

## 2021-12-20 NOTE — Progress Notes (Signed)
Wt Readings from Last 3 Encounters:  ?12/20/21 174 lb (78.9 kg)  ?10/24/21 169 lb (76.7 kg)  ?10/10/21 169 lb (76.7 kg)  ? ? ? ?Subjective:  ? ? Patient ID: Taylor Holmes, female    DOB: 1950/09/30, 71 y.o.   MRN: [id number] ? ?HPI ?Patient is a very sweet 71 year old African-American female who presents today with dyspnea on exertion.  Since I last saw the patient she has gained 5 or 6 pounds.  She believes that this is happened suddenly.  She has some swelling in both legs primarily in the feet.  She has trace edema in her shins and +1 edema in her feet.  She also has some faint bibasilar crackles.  She reports dyspnea on exertion.  She denies any cough.  She denies any pleurisy.  She denies any angina.  She denies any hemoptysis.  She denies any fevers or chills.  She denies any chest pain. ? ?Past Medical History:  ?Diagnosis Date  ?? Allergy   ?? Hyperlipidemia   ?? Hypertension   ? ?Past Surgical History:  ?Procedure Laterality Date  ?? ABDOMINAL HYSTERECTOMY    ?? ABDOMINAL HYSTERECTOMY N/A   ? Phreesia 05/10/2020  ?? EYE SURGERY N/A   ? [employer]reesia 05/10/2020  ? ?Current Outpatient Medications on File Prior to Visit  ?Medication Sig Dispense Refill  ?? cetirizine (ZYRTEC) 10 MG tablet Take 10 mg by mouth daily as needed for allergies.    ?? citalopram (CELEXA) 20 MG tablet TAKE 1 TABLET EVERY DAY 90 tablet 3  ?? esomeprazole (NEXIUM) 20 MG capsule TAKE 1 CAPSULE (20 MG TOTAL) BY MOUTH DAILY AT 12 NOON. 90 capsule 3  ?? hydrochlorothiazide (HYDRODIURIL) 25 MG tablet TAKE 1 TABLET EVERY DAY 90 tablet 3  ?? losartan (COZAAR) 100 MG tablet TAKE 1 TABLET EVERY DAY 90 tablet 3  ?? Melatonin 10 MG TABS Take by mouth.    ?? meloxicam (MOBIC) 15 MG tablet TAKE 1 TABLET EVERY DAY 90 tablet 2  ?? simvastatin (ZOCOR) 40 MG tablet TAKE 1 TABLET EVERY DAY AT BEDTIME. NEED OFFICE VISIT 90 tablet 1  ?? TRAVATAN Z 0.004 % SOLN ophthalmic solution INSTILL 1DROP IN EACH AFFECTED EYE ONCE A DAY (IN THE EVENING) FOR 30 DAYS  12   ? ?No current facility-administered medications on file prior to visit.  ? ?No Known Allergies ?Social History  ? ?Socioeconomic History  ?? Marital status: Married  ?  Spouse name: Barkley Bruns  ?? Number of children: 2  ?? Years of education: Not on file  ?? Highest education level: Not on file  ?Occupational History  ?? Not on file  ?Tobacco Use  ?? Smoking status: Never  ?? Smokeless tobacco: Never  ?Vaping Use  ?? Vaping Use: Never used  ?Substance and Sexual Activity  ?? Alcohol use: Yes  ?  Alcohol/week: 0.0 standard drinks  ?  Comment: "wine every now and again"  ?? Drug use: No  ?? Sexual activity: Yes  ?Other Topics Concern  ?? Not on file  ?Social History Narrative  ? 2 sons.  ? 3 grand children.  ? ?Social Determinants of Health  ? ?Financial Resource Strain: Low Risk   ?? Difficulty of Paying Living Expenses: Not hard at all  ?Food Insecurity: No Food Insecurity  ?? Worried About Charity fundraiser in the Last Year: Never true  ?? Ran Out of Food in the Last Year: Never true  ?Transportation Needs: No Transportation Needs  ?? Lack of Transportation (  Medical): No  ?? Lack of Transportation (Non-Medical): No  ?Physical Activity: Insufficiently Active  ?? Days of Exercise per Week: 2 days  ?? Minutes of Exercise per Session: 30 min  ?Stress: No Stress Concern Present  ?? Feeling of Stress : Only a little  ?Social Connections: Socially Integrated  ?? Frequency of Communication with Friends and Family: More than three times a week  ?? Frequency of Social Gatherings with Friends and Family: More than three times a week  ?? Attends Religious Services: More than 4 times per year  ?? Active Member of Clubs or Organizations: Yes  ?? Attends Archivist Meetings: More than 4 times per year  ?? Marital Status: Married  ?Intimate Partner Violence: Not At Risk  ?? Fear of Current or Ex-Partner: No  ?? Emotionally Abused: No  ?? Physically Abused: No  ?? Sexually Abused: No  ? ? family history is significant for  mother with dementia, a father with benign prostatic hypertrophy, a mother with seizure disorder. ? ? ?Review of Systems  ?All other systems reviewed and are negative. ? ?   ?Objective:  ? Physical Exam ?Vitals reviewed.  ?Constitutional:   ?   General: She is not in acute distress. ?   Appearance: Normal appearance. She is well-developed and normal weight. She is not diaphoretic.  ?HENT:  ?   Head: Normocephalic and atraumatic.  ?   Right Ear: Tympanic membrane, ear canal and external ear normal.  ?   Left Ear: Tympanic membrane, ear canal and external ear normal.  ?   Nose: Nose normal.  ?   Mouth/Throat:  ?   Pharynx: No oropharyngeal exudate.  ?Eyes:  ?   General: No visual field deficit or scleral icterus.    ?   Right eye: No discharge.     ?   Left eye: No discharge.  ?   Conjunctiva/sclera: Conjunctivae normal.  ?   Pupils: Pupils are equal, round, and reactive to light.  ?Neck:  ?   Thyroid: No thyromegaly.  ?   Vascular: No JVD.  ?   Trachea: No tracheal deviation.  ?Cardiovascular:  ?   Rate and Rhythm: Normal rate and regular rhythm.  ?   Heart sounds: Normal heart sounds. No murmur heard. ?  No friction rub. No gallop.  ?Pulmonary:  ?   Effort: Pulmonary effort is normal. No respiratory distress.  ?   Breath sounds: No stridor. Rales present. No wheezing.  ?Chest:  ?   Chest wall: No tenderness.  ?Abdominal:  ?   General: Bowel sounds are normal. There is no distension.  ?   Palpations: Abdomen is soft. There is no mass.  ?   Tenderness: There is no abdominal tenderness. There is no guarding or rebound.  ?Musculoskeletal:     ?   General: No tenderness. Normal range of motion.  ?   Cervical back: Normal range of motion and neck supple.  ?   Right lower leg: Edema present.  ?   Left lower leg: Edema present.  ?Lymphadenopathy:  ?   Cervical: No cervical adenopathy.  ?Skin: ?   General: Skin is warm.  ?   Coloration: Skin is not pale.  ?   Findings: No erythema or rash.  ?Neurological:  ?   Mental Status:  She is alert and oriented to person, place, and time. Mental status is at baseline.  ?   Cranial Nerves: No cranial nerve deficit, dysarthria or facial asymmetry.  ?  Motor: Motor function is intact. No tremor, atrophy, abnormal muscle tone or pronator drift.  ?   Coordination: Romberg sign negative. Coordination normal. Finger-Nose-Finger Test normal.  ?   Gait: Gait normal.  ?   Deep Tendon Reflexes: Reflexes are normal and symmetric.  ?Psychiatric:     ?   Behavior: Behavior normal.     ?   Thought Content: Thought content normal.     ?   Judgment: Judgment normal.  ? ? ? ? ? ?   ?Assessment & Plan:  ?Dyspnea, unspecified type - Plan: CBC with Differential/Platelet, Brain natriuretic peptide, COMPLETE METABOLIC PANEL WITH GFR, DG Chest 2 View ?I am concerned that the patient is showing signs of pulmonary edema and fluid retention.  Discontinue hydrochlorothiazide and replace with Lasix 40 mg a day with K-Dur 20 mill equivalents a day.  Obtain a chest x-ray to evaluate further.  Also check CBC CMP and a BNP.  Schedule the patient for an echocardiogram. ?

## 2021-12-21 LAB — COMPLETE METABOLIC PANEL WITH GFR
AG Ratio: 1.4 (calc) (ref 1.0–2.5)
ALT: 18 U/L (ref 6–29)
AST: 16 U/L (ref 10–35)
Albumin: 4 g/dL (ref 3.6–5.1)
Alkaline phosphatase (APISO): 71 U/L (ref 37–153)
BUN: 20 mg/dL (ref 7–25)
CO2: 25 mmol/L (ref 20–32)
Calcium: 9.2 mg/dL (ref 8.6–10.4)
Chloride: 106 mmol/L (ref 98–110)
Creat: 0.81 mg/dL (ref 0.60–1.00)
Globulin: 2.9 g/dL (calc) (ref 1.9–3.7)
Glucose, Bld: 101 mg/dL — ABNORMAL HIGH (ref 65–99)
Potassium: 3.5 mmol/L (ref 3.5–5.3)
Sodium: 141 mmol/L (ref 135–146)
Total Bilirubin: 0.2 mg/dL (ref 0.2–1.2)
Total Protein: 6.9 g/dL (ref 6.1–8.1)
eGFR: 77 mL/min/{1.73_m2} (ref >=60)

## 2021-12-21 LAB — CBC WITH DIFFERENTIAL/PLATELET
Absolute Monocytes: 693 cells/uL (ref 200–950)
Basophils Absolute: 20 cells/uL (ref 0–200)
Basophils Relative: 0.2 %
Eosinophils Absolute: 178 cells/uL (ref 15–500)
Eosinophils Relative: 1.8 %
HCT: 37.6 % (ref 35.0–45.0)
Hemoglobin: 12.6 g/dL (ref 11.7–15.5)
Lymphs Abs: 3465 cells/uL (ref 850–3900)
MCH: 30.4 pg (ref 27.0–33.0)
MCHC: 33.5 g/dL (ref 32.0–36.0)
MCV: 90.8 fL (ref 80.0–100.0)
MPV: 10.2 fL (ref 7.5–12.5)
Monocytes Relative: 7 %
Neutro Abs: 5544 cells/uL (ref 1500–7800)
Neutrophils Relative %: 56 %
Platelets: 249 10*3/uL (ref 140–400)
RBC: 4.14 10*6/uL (ref 3.80–5.10)
RDW: 12.8 % (ref 11.0–15.0)
Total Lymphocyte: 35 %
WBC: 9.9 10*3/uL (ref 3.8–10.8)

## 2021-12-21 LAB — BRAIN NATRIURETIC PEPTIDE: Brain Natriuretic Peptide: 21 pg/mL (ref <100)

## 2022-01-04 ENCOUNTER — Other Ambulatory Visit: Payer: Self-pay | Admitting: Family Medicine

## 2022-01-04 NOTE — Telephone Encounter (Signed)
last RF 12/20/21 #30 3 RF for both medications- too soon Requested Prescriptions  Refused Prescriptions Disp Refills  . KLOR-CON M20 20 MEQ tablet [Pharmacy Med Name: KLOR-CON M20 TABLET] 90 tablet 1    Sig: TAKE 1 TABLET BY [place] DAY     Endocrinology:  Minerals - Potassium Supplementation Passed - 01/04/2022  4:51 AM      Passed - K in normal range and within 360 days    Potassium  Date Value Ref Range Status  12/20/2021 3.5 3.5 - 5.3 mmol/L Final         Passed - Cr in normal range and within 360 days    Creat  Date Value Ref Range Status  12/20/2021 0.81 0.60 - 1.00 mg/dL Final         Passed - Valid encounter within last 12 months    Recent Outpatient Visits          2 weeks ago Dyspnea, unspecified type   [place] Susy Frizzle, MD   8 months ago [place] Dennard Schaumann, Cammie Mcgee, MD   1 year ago Postmenopausal estrogen deficiency   [place] Susy Frizzle, MD   3 years ago Radiculopathy, unspecified spinal region   [place] Pickard, Cammie Mcgee, MD   4 years ago Mid back pain   [place] Pickard, Cammie Mcgee, MD             . furosemide (LASIX) 40 MG tablet [Pharmacy Med Name: FUROSEMIDE 40 MG TABLET] 90 tablet 1    Sig: TAKE 1 TABLET BY MOUTH EVERY DAY     Cardiovascular:  Diuretics - Loop Failed - 01/04/2022  4:51 AM      Failed - Mg Level in normal range and within 180 days    No results found for: MG       Passed - K in normal range and within 180 days    Potassium  Date Value Ref Range Status  12/20/2021 3.5 3.5 - 5.3 mmol/L Final         Passed - Ca in normal range and within 180 days    Calcium  Date Value Ref Range Status  12/20/2021 9.2 8.6 - 10.4 mg/dL Final         Passed - Na in normal range and within 180 days    Sodium  Date Value Ref Range Status  12/20/2021 141 135 - 146 mmol/L Final         Passed - Cr in normal range  and within 180 days    Creat  Date Value Ref Range Status  12/20/2021 0.81 0.60 - 1.00 mg/dL Final         Passed - Cl in normal range and within 180 days    Chloride  Date Value Ref Range Status  12/20/2021 106 98 - 110 mmol/L Final         Passed - Last BP in normal range    BP Readings from Last 1 Encounters:  12/20/21 130/70         Passed - Valid encounter within last 6 months    Recent Outpatient Visits          2 weeks ago Dyspnea, unspecified type   [place], Warren T, MD   8 months ago [place], Cammie Mcgee, MD   1 year ago  Postmenopausal estrogen deficiency   [place] Pickard, Cammie Mcgee, MD   3 years ago Radiculopathy, unspecified spinal region   [place], Cammie Mcgee, MD   4 years ago Mid back pain   [place] Pickard, Cammie Mcgee, MD

## 2022-01-13 ENCOUNTER — Ambulatory Visit (HOSPITAL_COMMUNITY): Payer: Medicare PPO | Attending: Internal Medicine

## 2022-01-13 DIAGNOSIS — R0600 Dyspnea, unspecified: Secondary | ICD-10-CM | POA: Diagnosis not present

## 2022-01-15 LAB — ECHOCARDIOGRAM COMPLETE
Area-P 1/2: 3.83 cm2
S' Lateral: 1.5 cm

## 2022-01-25 ENCOUNTER — Other Ambulatory Visit: Payer: Self-pay | Admitting: Family Medicine

## 2022-01-26 NOTE — Telephone Encounter (Signed)
Requested medications are due for refill today.  yes  Requested medications are on the active medications list.  yes  Last refill. 04/06/2021 #90 1 refill  Future visit scheduled.   yes  Notes to clinic.  Medication refill failed protocol d/t expired labs.    Requested Prescriptions  Pending Prescriptions Disp Refills   simvastatin (ZOCOR) 40 MG tablet [Pharmacy Med Name: SIMVASTATIN 40 MG Tablet] 90 tablet 1    Sig: TAKE 1 TABLET EVERY DAY AT BEDTIME. NEED OFFICE VISIT     Cardiovascular:  Antilipid - Statins Failed - 01/26/2022  8:32 AM      Failed - Lipid Panel in normal range within the last 12 months    Cholesterol  Date Value Ref Range Status  05/13/2020 183 <200 mg/dL Final   LDL Cholesterol (Calc)  Date Value Ref Range Status  05/13/2020 116 (H) mg/dL (calc) Final    Comment:    Reference range: <100 . Desirable range <100 mg/dL for primary prevention;   <70 mg/dL for patients with CHD or diabetic patients  with > or = 2 CHD risk factors. [place] Kitchen LDL-C is now calculated using the Martin-Hopkins  calculation, which is a validated novel method providing  better accuracy than the Friedewald equation in the  estimation of LDL-C.  Cresenciano Genre et al. Annamaria Helling. 6295;284(13): 2061-2068  (http://education.[facility].com/faq/FAQ164)    HDL  Date Value Ref Range Status  05/13/2020 51 > OR = 50 mg/dL Final   Triglycerides  Date Value Ref Range Status  05/13/2020 67 <150 mg/dL Final         Passed - Patient is not pregnant      Passed - Valid encounter within last 12 months    Recent Outpatient Visits           1 month ago Dyspnea, unspecified type   [place] Pickard, Cammie Mcgee, MD   9 months ago [place], Cammie Mcgee, MD   1 year ago Postmenopausal estrogen deficiency   [place] Dennard Schaumann Cammie Mcgee, MD   3 years ago Radiculopathy, unspecified spinal region   [place], Cammie Mcgee, MD   4 years ago Mid back pain   [place] Pickard, Cammie Mcgee, MD

## 2022-02-17 NOTE — Telephone Encounter (Signed)
LOV 12/20/21  Can this be sent in for patient?

## 2022-03-06 DIAGNOSIS — I8311 Varicose veins of right lower extremity with inflammation: Secondary | ICD-10-CM | POA: Diagnosis not present

## 2022-03-06 DIAGNOSIS — I8312 Varicose veins of left lower extremity with inflammation: Secondary | ICD-10-CM | POA: Diagnosis not present

## 2022-04-19 ENCOUNTER — Other Ambulatory Visit: Payer: Self-pay | Admitting: Family Medicine

## 2022-04-20 NOTE — Telephone Encounter (Signed)
Requested Prescriptions  Pending Prescriptions Disp Refills  . KLOR-CON M20 20 MEQ tablet [Pharmacy Med Name: KLOR-CON M20 TABLET] 90 tablet 1    Sig: TAKE 1 TABLET BY [place] DAY     Endocrinology:  Minerals - Potassium Supplementation Passed - 04/19/2022  2:02 AM      Passed - K in normal range and within 360 days    Potassium  Date Value Ref Range Status  12/20/2021 3.5 3.5 - 5.3 mmol/L Final         Passed - Cr in normal range and within 360 days    Creat  Date Value Ref Range Status  12/20/2021 0.81 0.60 - 1.00 mg/dL Final         Passed - Valid encounter within last 12 months    Recent Outpatient Visits          4 months ago Dyspnea, unspecified type   [place] Susy Frizzle, MD   11 months ago [place], Cammie Mcgee, MD   1 year ago Postmenopausal estrogen deficiency   [place] Susy Frizzle, MD   3 years ago Radiculopathy, unspecified spinal region   [place] Pickard, Cammie Mcgee, MD   4 years ago Mid back pain   [place] Pickard, Cammie Mcgee, MD             . furosemide (LASIX) 40 MG tablet [Pharmacy Med Name: FUROSEMIDE 40 MG TABLET] 90 tablet 0    Sig: TAKE 1 TABLET BY MOUTH EVERY DAY     Cardiovascular:  Diuretics - Loop Failed - 04/19/2022  2:02 AM      Failed - Mg Level in normal range and within 180 days    No results found for: "MG"       Passed - K in normal range and within 180 days    Potassium  Date Value Ref Range Status  12/20/2021 3.5 3.5 - 5.3 mmol/L Final         Passed - Ca in normal range and within 180 days    Calcium  Date Value Ref Range Status  12/20/2021 9.2 8.6 - 10.4 mg/dL Final         Passed - Na in normal range and within 180 days    Sodium  Date Value Ref Range Status  12/20/2021 141 135 - 146 mmol/L Final         Passed - Cr in normal range and within 180 days    Creat  Date Value Ref Range  Status  12/20/2021 0.81 0.60 - 1.00 mg/dL Final         Passed - Cl in normal range and within 180 days    Chloride  Date Value Ref Range Status  12/20/2021 106 98 - 110 mmol/L Final         Passed - Last BP in normal range    BP Readings from Last 1 Encounters:  12/20/21 130/70         Passed - Valid encounter within last 6 months    Recent Outpatient Visits          4 months ago Dyspnea, unspecified type   [place], Warren T, MD   11 months ago [place], Cammie Mcgee, MD   1 year ago Postmenopausal estrogen deficiency   [place] Jenna Luo  T, MD   3 years ago Radiculopathy, unspecified spinal region   [place], Cammie Mcgee, MD   4 years ago Mid back pain   [place] Pickard, Cammie Mcgee, MD

## 2022-06-09 DIAGNOSIS — H401111 Primary open-angle glaucoma, right eye, mild stage: Secondary | ICD-10-CM | POA: Diagnosis not present

## 2022-06-13 ENCOUNTER — Ambulatory Visit: Payer: Medicare PPO

## 2022-06-25 ENCOUNTER — Other Ambulatory Visit: Payer: Self-pay | Admitting: Family Medicine

## 2022-08-17 ENCOUNTER — Ambulatory Visit: Payer: Medicare PPO

## 2022-08-17 VITALS — Ht 61.0 in | Wt 170.0 lb

## 2022-08-17 DIAGNOSIS — Z1231 Encounter for screening mammogram for malignant neoplasm of breast: Secondary | ICD-10-CM

## 2022-08-17 DIAGNOSIS — Z0000 Encounter for general adult medical examination without abnormal findings: Secondary | ICD-10-CM

## 2022-08-17 NOTE — Patient Instructions (Signed)
[name] , Thank you for taking time to come for your Medicare Wellness Visit. I appreciate your ongoing commitment to your health goals. Please review the following plan we discussed and let me know if I can assist you in the future.   These are the goals we discussed:  Goals      Exercise 3x per week (30 min per time)     Increase exercise. Plans to retire Oct 2023.        This is a list of the screening recommended for you and due dates:  Health Maintenance  Topic Date Due   DTaP/Tdap/Td vaccine (2 - Td or Tdap) 05/17/2021   Mammogram  12/02/2021   Flu Shot  03/07/2022   Medicare Annual Wellness Visit  08/18/2023   Colon Cancer Screening  10/25/2026   Pneumonia Vaccine  Completed   DEXA scan (bone density measurement)  Completed   COVID-19 Vaccine  Completed   Hepatitis C Screening: USPSTF Recommendation to screen - Ages 59-79 yo.  Completed   Zoster (Shingles) Vaccine  Completed   HPV Vaccine  Aged Out    Advanced directives: Forms are available if you choose in the future to pursue completion.  This is recommended in order to make sure that your health wishes are honored in the event that you are unable to verbalize them to the provider.    Conditions/risks identified: Aim for 30 minutes of exercise or brisk walking, 6-8 glasses of water, and 5 servings of fruits and vegetables each day.   Next appointment: Follow up in one year for your annual wellness visit   The number to schedule your mammogram for the [place] screening that will be at [facility] on 09/01/22 is [phone]   Preventive Care 35 Years and Older, Female Preventive care refers to lifestyle choices and visits with your health care provider that can promote health and wellness. What does preventive care include? A yearly physical exam. This is also called an annual well check. Dental exams once or twice a year. Routine eye exams. Ask your health care provider how often you should  have your eyes checked. Personal lifestyle choices, including: Daily care of your teeth and gums. Regular physical activity. Eating a healthy diet. Avoiding tobacco and drug use. Limiting alcohol use. Practicing safe sex. Taking low-dose aspirin every day. Taking vitamin and mineral supplements as recommended by your health care provider. What happens during an annual well check? The services and screenings done by your health care provider during your annual well check will depend on your age, overall health, lifestyle risk factors, and family history of disease. Counseling  Your health care provider may ask you questions about your: Alcohol use. Tobacco use. Drug use. Emotional well-being. Home and relationship well-being. Sexual activity. Eating habits. History of falls. Memory and ability to understand (cognition). Work and work Statistician. Reproductive health. Screening  You may have the following tests or measurements: Height, weight, and BMI. Blood pressure. Lipid and cholesterol levels. These may be checked every 5 years, or more frequently if you are over 60 years old. Skin check. Lung cancer screening. You may have this screening every year starting at age 67 if you have a 30-pack-year history of smoking and currently smoke or have quit within the past 15 years. Fecal occult blood test (FOBT) of the stool. You may have this test every year starting at age 65. Flexible sigmoidoscopy or colonoscopy. You may have a sigmoidoscopy every 5 years or a  colonoscopy every 10 years starting at age 53. Hepatitis C blood test. Hepatitis B blood test. Sexually transmitted disease (STD) testing. Diabetes screening. This is done by checking your blood sugar (glucose) after you have not eaten for a while (fasting). You may have this done every 1-3 years. Bone density scan. This is done to screen for osteoporosis. You may have this done starting at age 53. Mammogram. This may be done  every 1-2 years. Talk to your health care provider about how often you should have regular mammograms. Talk with your health care provider about your test results, treatment options, and if necessary, the need for more tests. Vaccines  Your health care provider may recommend certain vaccines, such as: Influenza vaccine. This is recommended every year. Tetanus, diphtheria, and acellular pertussis (Tdap, Td) vaccine. You may need a Td booster every 10 years. Zoster vaccine. You may need this after age 59. Pneumococcal 13-valent conjugate (PCV13) vaccine. One dose is recommended after age 78. Pneumococcal polysaccharide (PPSV23) vaccine. One dose is recommended after age 59. Talk to your health care provider about which screenings and vaccines you need and how often you need them. This information is not intended to replace advice given to you by your health care provider. Make sure you discuss any questions you have with your health care provider. Document Released: 08/20/2015 Document Revised: 04/12/2016 Document Reviewed: 05/25/2015 Elsevier Interactive Patient Education  2017 [place] Prevention in the Home Falls can cause injuries. They can happen to people of all ages. There are many things you can do to make your home safe and to help prevent falls. What can I do on the outside of my home? Regularly fix the edges of walkways and driveways and fix any cracks. Remove anything that might make you trip as you walk through a door, such as a raised step or threshold. Trim any bushes or trees on the path to your home. Use bright outdoor lighting. Clear any walking paths of anything that might make someone trip, such as rocks or tools. Regularly check to see if handrails are loose or broken. Make sure that both sides of any steps have handrails. Any raised decks and porches should have guardrails on the edges. Have any leaves, snow, or ice cleared regularly. Use sand or salt on  walking paths during winter. Clean up any spills in your garage right away. This includes oil or grease spills. What can I do in the bathroom? Use night lights. Install grab bars by the toilet and in the tub and shower. Do not use towel bars as grab bars. Use non-skid mats or decals in the tub or shower. If you need to sit down in the shower, use a plastic, non-slip stool. Keep the floor dry. Clean up any water that spills on the floor as soon as it happens. Remove soap buildup in the tub or shower regularly. Attach bath mats securely with double-sided non-slip rug tape. Do not have throw rugs and other things on the floor that can make you trip. What can I do in the bedroom? Use night lights. Make sure that you have a light by your bed that is easy to reach. Do not use any sheets or blankets that are too big for your bed. They should not hang down onto the floor. Have a firm chair that has side arms. You can use this for support while you get dressed. Do not have throw rugs and other things on the floor that can  make you trip. What can I do in the kitchen? Clean up any spills right away. Avoid walking on wet floors. Keep items that you use a lot in easy-to-reach places. If you need to reach something above you, use a strong step stool that has a grab bar. Keep electrical cords out of the way. Do not use floor polish or wax that makes floors slippery. If you must use wax, use non-skid floor wax. Do not have throw rugs and other things on the floor that can make you trip. What can I do with my stairs? Do not leave any items on the stairs. Make sure that there are handrails on both sides of the stairs and use them. Fix handrails that are broken or loose. Make sure that handrails are as long as the stairways. Check any carpeting to make sure that it is firmly attached to the stairs. Fix any carpet that is loose or worn. Avoid having throw rugs at the top or bottom of the stairs. If you do  have throw rugs, attach them to the floor with carpet tape. Make sure that you have a light switch at the top of the stairs and the bottom of the stairs. If you do not have them, ask someone to add them for you. What else can I do to help prevent falls? Wear shoes that: Do not have high heels. Have rubber bottoms. Are comfortable and fit you well. Are closed at the toe. Do not wear sandals. If you use a stepladder: Make sure that it is fully opened. Do not climb a closed stepladder. Make sure that both sides of the stepladder are locked into place. Ask someone to hold it for you, if possible. Clearly mark and make sure that you can see: Any grab bars or handrails. First and last steps. Where the edge of each step is. Use tools that help you move around (mobility aids) if they are needed. These include: Canes. Walkers. Scooters. Crutches. Turn on the lights when you go into a dark area. Replace any light bulbs as soon as they burn out. Set up your furniture so you have a clear path. Avoid moving your furniture around. If any of your floors are uneven, fix them. If there are any pets around you, be aware of where they are. Review your medicines with your doctor. Some medicines can make you feel dizzy. This can increase your chance of falling. Ask your doctor what other things that you can do to help prevent falls. This information is not intended to replace advice given to you by your health care provider. Make sure you discuss any questions you have with your health care provider. Document Released: 05/20/2009 Document Revised: 12/30/2015 Document Reviewed: 08/28/2014 Elsevier Interactive Patient Education  2017 [employer].

## 2022-08-17 NOTE — Progress Notes (Signed)
Subjective:   Taylor Holmes is a 72 y.o. female who presents for Medicare Annual (Subsequent) preventive examination.  I connected with  Taylor Holmes on 08/17/22 by a audio enabled telemedicine application and verified that I am speaking with the correct person using two identifiers.  Patient Location: Home  Provider Location: Office/Clinic  I discussed the limitations of evaluation and management by telemedicine. The patient expressed understanding and agreed to proceed.  Review of Systems     Cardiac Risk Factors include: advanced age (>53mn, >>71women);hypertension;dyslipidemia     Objective:    Today's Vitals   08/17/22 1224  Weight: 170 lb (77.1 kg)  Height: '5\' 1"'$  (1.549 m)   Body mass index is 32.12 kg/m.     08/17/2022   12:25 PM 08/11/2021   12:16 PM 11/26/2015   11:10 AM  Advanced Directives  Does Patient Have a Medical Advance Directive? No No No  Would patient like information on creating a medical advance directive? No - Patient declined No - Patient declined No - patient declined information    Current Medications (verified) Outpatient Encounter Medications as of 08/17/2022  Medication Sig   cetirizine (ZYRTEC) 10 MG tablet Take 10 mg by mouth daily as needed for allergies.   citalopram (CELEXA) 20 MG tablet TAKE 1 TABLET EVERY DAY   esomeprazole (NEXIUM) 20 MG capsule TAKE 1 CAPSULE BY MOUTH EVERY DAY AT 12 NOON   furosemide (LASIX) 40 MG tablet TAKE 1 TABLET BY MOUTH EVERY DAY   hydrochlorothiazide (HYDRODIURIL) 25 MG tablet TAKE 1 TABLET EVERY DAY   KLOR-CON M20 20 MEQ tablet TAKE 1 TABLET BY MOUTH EVERY DAY   losartan (COZAAR) 100 MG tablet TAKE 1 TABLET EVERY DAY   Melatonin 10 MG TABS Take by mouth.   meloxicam (MOBIC) 15 MG tablet TAKE 1 TABLET EVERY DAY   simvastatin (ZOCOR) 40 MG tablet TAKE 1 TABLET EVERY DAY AT BEDTIME. NEED OFFICE VISIT   TRAVATAN Z 0.004 % SOLN ophthalmic solution INSTILL 1DROP IN EACH AFFECTED EYE ONCE A DAY (IN  THE EVENING) FOR 30 DAYS   No facility-administered encounter medications on file as of 08/17/2022.    Allergies (verified) Patient has no known allergies.   History: Past Medical History:  Diagnosis Date   Allergy    Hyperlipidemia    Hypertension    Past Surgical History:  Procedure Laterality Date   ABDOMINAL HYSTERECTOMY     ABDOMINAL HYSTERECTOMY N/A    Phreesia 05/10/2020   EYE SURGERY N/A    Phreesia 05/10/2020   Family History  Problem Relation Age of Onset   Colon cancer Father    Colon cancer Brother    Breast cancer Paternal Aunt    Esophageal cancer Neg Hx    Stomach cancer Neg Hx    Rectal cancer Neg Hx    Social History   Socioeconomic History   Marital status: Married    Spouse name: EBarkley Holmes  Number of children: 2   Years of education: Not on file   Highest education level: Not on file  Occupational History   Not on file  Tobacco Use   Smoking status: Never   Smokeless tobacco: Never  Vaping Use   Vaping Use: Never used  Substance and Sexual Activity   Alcohol use: Yes    Alcohol/week: 0.0 standard drinks of alcohol    Comment: "wine every now and again"   Drug use: No   Sexual activity: Yes  Other Topics Concern  Not on file  Social History Narrative   2 sons.   3 grand children.   Social Determinants of Health   Financial Resource Strain: Low Risk  (08/17/2022)   Overall Financial Resource Strain (CARDIA)    Difficulty of Paying Living Expenses: Not hard at all  Food Insecurity: No Food Insecurity (08/17/2022)   Hunger Vital Sign    Worried About Running Out of Food in the Last Year: Never true    Ran Out of Food in the Last Year: Never true  Transportation Needs: No Transportation Needs (08/17/2022)   PRAPARE - Hydrologist (Medical): No    Lack of Transportation (Non-Medical): No  Physical Activity: Sufficiently Active (08/17/2022)   Exercise Vital Sign    Days of Exercise per Week: 5 days    Minutes  of Exercise per Session: 30 min  Stress: No Stress Concern Present (08/17/2022)   [place]    Feeling of Stress : Not at all  Social Connections: [place] (08/17/2022)   Social Connection and Isolation Panel [NHANES]    Frequency of Communication with Friends and Family: More than three times a week    Frequency of Social Gatherings with Friends and Family: Three times a week    Attends Religious Services: More than 4 times per year    Active Member of Clubs or Organizations: Yes    Attends Music therapist: More than 4 times per year    Marital Status: Married    Tobacco Counseling Counseling given: Not Answered   Clinical Intake:  Pre-visit preparation completed: Yes  Pain : No/denies pain  Diabetes: No  How often do you need to have someone help you when you read instructions, pamphlets, or other written materials from your doctor or pharmacy?: 1 - Never  Diabetic?No   Interpreter Needed?: No  Information entered by :: Taylor George LPN   Activities of Daily Living    08/17/2022   12:33 PM  In your present state of health, do you have any difficulty performing the following activities:  Hearing? 0  Vision? 0  Difficulty concentrating or making decisions? 0  Walking or climbing stairs? 0  Dressing or bathing? 0  Doing errands, shopping? 0  Preparing Food and eating ? N  Using the Toilet? N  In the past six months, have you accidently leaked urine? N  Do you have problems with loss of bowel control? N  Managing your Medications? N  Managing your Finances? N  Housekeeping or managing your Housekeeping? N    Patient Care Team: Taylor Frizzle, MD as PCP - General (Family Medicine) Opthamology, Taylor Holmes (Ophthalmology) Taylor Artist, MD as Consulting Physician (Gastroenterology)  Indicate any recent Medical Services you may have received from other than [facility]  providers in the past year (date may be approximate).     Assessment:   This is a routine wellness examination for Taylor Holmes.  Hearing/Vision screen Hearing Screening - Comments:: Denies hearing difficulties   Vision Screening - Comments:: Wears rx glasses - up to date with routine eye exams with [facility]   Dietary issues and exercise activities discussed: Current Exercise Habits: Home exercise routine, Type of exercise: walking, Time (Minutes): 30, Frequency (Times/Week): 5, Weekly Exercise (Minutes/Week): 150, Intensity: Mild   Goals Addressed             This Visit's Progress    Exercise 3x per week (30 min per  time)   On track    Increase exercise. Plans to retire Oct 2023.       Depression Screen    08/17/2022   12:39 PM 08/11/2021   12:12 PM 05/13/2020    9:27 AM 08/16/2017   11:34 AM  PHQ 2/9 Scores  PHQ - 2 Score 0 1 0 0    Fall Risk    08/17/2022   12:20 PM 08/11/2021   12:17 PM 05/13/2020    9:27 AM 08/16/2017   11:34 AM  Fall Risk   Falls in the past year? 0 0 0 No  Number falls in past yr: 0 0 0   Injury with Fall? 0 0 0   Risk for fall due to : No Fall Risks No Fall Risks    Follow up Falls evaluation completed;Education provided;Falls prevention discussed Falls prevention discussed      FALL RISK PREVENTION PERTAINING TO THE HOME:  Any stairs in or around the home? Yes  If so, are there any without handrails? No  Home free of loose throw rugs in walkways, pet beds, electrical cords, etc? Yes  Adequate lighting in your home to reduce risk of falls? Yes   ASSISTIVE DEVICES UTILIZED TO PREVENT FALLS:  Life alert? No  Use of a cane, walker or w/c? No  Grab bars in the bathroom? Yes  Shower chair or bench in shower? No  Elevated toilet seat or a handicapped toilet? Yes   TIMED UP AND GO:  Was the test performed? No . Telephonic visit   Cognitive Function:        08/17/2022   12:33 PM 08/11/2021   12:20 PM 05/13/2020    9:28 AM   6CIT Screen  What Year? 0 points 0 points 0 points  What month? 0 points 0 points 0 points  What time? 0 points 0 points 0 points  Count back from 20 0 points 0 points 0 points  Months in reverse 0 points 0 points 0 points  Repeat phrase 0 points 0 points 0 points  Total Score 0 points 0 points 0 points    Immunizations Immunization History  Administered Date(s) Administered   Covid-19, Mrna,Vaccine(Spikevax)54yr and older 05/24/2022   Fluad Quad(high Dose 65+) 04/04/2019, 05/13/2020, 04/28/2021   Influenza Split 05/07/2014, 05/31/2016   Influenza, High Dose Seasonal PF 06/02/2017, 05/27/2018   Influenza-Unspecified 05/22/2015, 05/12/2022   Moderna SARS-COV2 Booster Vaccination 06/03/2020   PFIZER(Purple Top)SARS-COV-2 Vaccination 08/05/2019, 09/02/2019, 06/03/2020, 03/07/2021   Pneumococcal Conjugate-13 05/13/2020   Pneumococcal Polysaccharide-23 06/28/2016   Tdap 05/18/2011   Zoster Recombinat (Shingrix) 02/12/2020, 05/28/2020    TDAP status: Up to date  Flu Vaccine status: Up to date  Pneumococcal vaccine status: Up to date  Covid-19 vaccine status: Information provided on how to obtain vaccines.   Qualifies for Shingles Vaccine? Yes   Zostavax completed No   Shingrix Completed?: Yes  Screening Tests Health Maintenance  Topic Date Due   DTaP/Tdap/Td (2 - Td or Tdap) 05/17/2021   MAMMOGRAM  12/02/2021   Medicare Annual Wellness (AWV)  08/18/2023   COLONOSCOPY (Pts 45-421yrInsurance coverage will need to be confirmed)  10/25/2026   Pneumonia Vaccine 6539Years old  Completed   INFLUENZA VACCINE  Completed   DEXA SCAN  Completed   COVID-19 Vaccine  Completed   Hepatitis C Screening  Completed   Zoster Vaccines- Shingrix  Completed   HPV VACCINES  Aged Out    Health Maintenance  Health Maintenance Due  Topic Date  Due   DTaP/Tdap/Td (2 - Td or Tdap) 05/17/2021   MAMMOGRAM  12/02/2021    Colorectal cancer screening: Type of screening: Colonoscopy.  Completed 10/24/21. Repeat every 10 years  Mammogram status: Completed 12/02/20. Repeat every year  Bone Density status: Completed 12/02/20. Results reflect: Bone density results: OSTEOPENIA. Repeat every 2 years.  Lung Cancer Screening: (Low Dose CT Chest recommended if Age 65-80 years, 30 pack-year currently smoking OR have quit w/in 15years.) does not qualify.   Lung Cancer Screening Referral: n/a   Additional Screening:  Hepatitis C Screening: does qualify; Completed 06/26/17  Vision Screening: Recommended annual ophthalmology exams for early detection of glaucoma and other disorders of the eye. Is the patient up to date with their annual eye exam?  Yes  Who is the provider or what is the name of the office in which the patient attends annual eye exams? [facility] If pt is not established with a provider, would they like to be referred to a provider to establish care? No .   Dental Screening: Recommended annual dental exams for proper oral hygiene  Community Resource Referral / Chronic Care Management: CRR required this visit?  No   CCM required this visit?  No      Plan:     I have personally reviewed and noted the following in the patient's chart:   Medical and social history Use of alcohol, tobacco or illicit drugs  Current medications and supplements including opioid prescriptions. Patient is not currently taking opioid prescriptions. Functional ability and status Nutritional status Physical activity Advanced directives List of other physicians Hospitalizations, surgeries, and ER visits in previous 12 months Vitals Screenings to include cognitive, depression, and falls Referrals and appointments  In addition, I have reviewed and discussed with patient certain preventive protocols, quality metrics, and best practice recommendations. A written personalized care plan for preventive services as well as general preventive health recommendations were provided  to patient.     Vanetta Mulders, [place]   6/80/8811   Due to this being a virtual visit, the after visit summary with patients personalized plan was offered to patient via mail or my-chart. Patient would like to access on my-chart  Nurse Notes: No concerns

## 2022-08-19 ENCOUNTER — Other Ambulatory Visit: Payer: Self-pay | Admitting: Family Medicine

## 2022-08-21 NOTE — Telephone Encounter (Signed)
Requested Prescriptions  Pending Prescriptions Disp Refills   losartan (COZAAR) 100 MG tablet [Pharmacy Med Name: LOSARTAN POTASSIUM 100 MG Tablet] 90 tablet 0    Sig: TAKE 1 TABLET EVERY DAY     Cardiovascular:  Angiotensin Receptor Blockers Failed - 08/19/2022  1:12 AM      Failed - Cr in normal range and within 180 days    Creat  Date Value Ref Range Status  12/20/2021 0.81 0.60 - 1.00 mg/dL Final         Failed - K in normal range and within 180 days    Potassium  Date Value Ref Range Status  12/20/2021 3.5 3.5 - 5.3 mmol/L Final         Failed - Valid encounter within last 6 months    Recent Outpatient Visits           8 months ago Dyspnea, unspecified type   [place] Susy Frizzle, MD   1 year ago [place], Cammie Mcgee, MD   2 years ago Postmenopausal estrogen deficiency   [place] Dennard Schaumann, Cammie Mcgee, MD   4 years ago Radiculopathy, unspecified spinal region   [place] Pickard, Cammie Mcgee, MD   4 years ago Mid back pain   [place] Pickard, Cammie Mcgee, MD              Passed - Patient is not pregnant      Passed - Last BP in normal range    BP Readings from Last 1 Encounters:  12/20/21 130/70          meloxicam (MOBIC) 15 MG tablet [Pharmacy Med Name: MELOXICAM 15 MG Tablet] 90 tablet 0    Sig: TAKE 1 TABLET EVERY DAY     Analgesics:  COX2 Inhibitors Failed - 08/19/2022  1:12 AM      Failed - Manual Review: Labs are only required if the patient has taken medication for more than 8 weeks.      Passed - HGB in normal range and within 360 days    Hemoglobin  Date Value Ref Range Status  12/20/2021 12.6 11.7 - 15.5 g/dL Final         Passed - Cr in normal range and within 360 days    Creat  Date Value Ref Range Status  12/20/2021 0.81 0.60 - 1.00 mg/dL Final         Passed - HCT in normal range and within 360 days    HCT  Date  Value Ref Range Status  12/20/2021 37.6 35.0 - 45.0 % Final         Passed - AST in normal range and within 360 days    AST  Date Value Ref Range Status  12/20/2021 16 10 - 35 U/L Final         Passed - ALT in normal range and within 360 days    ALT  Date Value Ref Range Status  12/20/2021 18 6 - 29 U/L Final         Passed - eGFR is 30 or above and within 360 days    GFR, Est African American  Date Value Ref Range Status  05/13/2020 88 > OR = 60 mL/min/1.15m Final   GFR, Est Non African American  Date Value Ref Range Status  05/13/2020 76 > OR = 60 mL/min/1.755mFinal   eGFR  Date Value Ref Range  Status  12/20/2021 77 > OR = 60 mL/min/1.80m Final    Comment:    The eGFR is based on the CKD-EPI 2021 equation. To calculate  the new eGFR from a previous Creatinine or Cystatin C result, go to https://www.kidney.org/professionals/ kdoqi/gfr%5Fcalculator          Passed - Patient is not pregnant      Passed - Valid encounter within last 12 months    Recent Outpatient Visits           8 months ago Dyspnea, unspecified type   [place], WCammie Mcgee MD   1 year ago [place] WCammie Mcgee MD   2 years ago Postmenopausal estrogen deficiency   [place]ammie Mcge[place]   4 years ago Radiculopathy, unspecified spinal region   [place]ammie Mcgee MD   4 years ago Mid back pain   [place], WCammie Mcgee MD

## 2022-08-24 ENCOUNTER — Other Ambulatory Visit: Payer: Self-pay | Admitting: Family Medicine

## 2022-08-25 ENCOUNTER — Telehealth: Payer: Self-pay

## 2022-08-25 ENCOUNTER — Other Ambulatory Visit: Payer: Self-pay

## 2022-08-25 DIAGNOSIS — E7800 Pure hypercholesterolemia, unspecified: Secondary | ICD-10-CM

## 2022-08-25 MED ORDER — CITALOPRAM HYDROBROMIDE 20 MG PO TABS: 20.0000 mg | ORAL_TABLET | Freq: Every day | ORAL | 0 refills | Status: DC

## 2022-08-25 MED ORDER — MELOXICAM 15 MG PO TABS: 15.0000 mg | ORAL_TABLET | Freq: Every day | ORAL | 0 refills | Status: DC

## 2022-08-25 MED ORDER — LOSARTAN POTASSIUM 100 MG PO TABS: 100.0000 mg | ORAL_TABLET | Freq: Every day | ORAL | 0 refills | Status: DC

## 2022-08-25 MED ORDER — SIMVASTATIN 40 MG PO TABS: ORAL_TABLET | ORAL | 0 refills | Status: DC

## 2022-08-25 NOTE — Telephone Encounter (Signed)
Prescription Request  08/25/2022  Is this a "Controlled Substance" medicine? No  LOV: 12/20/21  What is the name of the medication or equipment? simvastatin (ZOCOR) 40 MG tablet [[id number]]  Have you contacted your pharmacy to request a refill? Yes   Which pharmacy would you like this sent to?  [id number] [address] [address] [address]Phone: [phone]: [phone]   Patient notified that their request is being sent to the clinical staff for review and that they should receive a response within 2 business days.   Please advise at [facility]

## 2022-09-19 ENCOUNTER — Ambulatory Visit
Admission: RE | Admit: 2022-09-19 | Discharge: 2022-09-19 | Disposition: A | Discharge status: Home / Self Care | Payer: Medicare PPO | Source: Ambulatory Visit | Attending: Family Medicine | Admitting: Family Medicine

## 2022-09-19 DIAGNOSIS — Z1231 Encounter for screening mammogram for malignant neoplasm of breast: Secondary | ICD-10-CM

## 2022-10-09 ENCOUNTER — Other Ambulatory Visit: Payer: Medicare PPO

## 2022-10-12 ENCOUNTER — Ambulatory Visit (INDEPENDENT_AMBULATORY_CARE_PROVIDER_SITE_OTHER): Payer: Medicare PPO | Admitting: Family Medicine

## 2022-10-12 ENCOUNTER — Encounter: Payer: Self-pay | Admitting: Family Medicine

## 2022-10-12 VITALS — BP 132/70 | HR 62 | Temp 97.8°F | Ht 61.0 in | Wt 170.0 lb

## 2022-10-12 DIAGNOSIS — I10 Essential (primary) hypertension: Secondary | ICD-10-CM

## 2022-10-12 DIAGNOSIS — E7800 Pure hypercholesterolemia, unspecified: Secondary | ICD-10-CM

## 2022-10-12 DIAGNOSIS — Z0000 Encounter for general adult medical examination without abnormal findings: Secondary | ICD-10-CM

## 2022-10-12 DIAGNOSIS — Z0001 Encounter for general adult medical examination with abnormal findings: Secondary | ICD-10-CM | POA: Diagnosis not present

## 2022-10-12 NOTE — Progress Notes (Signed)
Subjective:    Patient ID: Taylor Holmes, female    DOB: 1950/10/26, 72 y.o.   MRN: [id number]  HPI Patient is a very pleasant-year-old African-American female here today for complete physical exam.  She denies any concerns.  Her blood pressure is well-controlled at 132/70.  She denies any falls, memory loss, or depression.  Her last colonoscopy was in 2023 and revealed only diverticulosis.  She had a bone density test in 2022 that showed a T-score of -1.2 but was otherwise normal.  She had a mammogram earlier this year in February which was normal.  Due to her age she does not require Pap smear.  Her most recent immunizations are up-to-date as shown below Immunization History  Administered Date(s) Administered   Covid-19, Mrna,Vaccine(Spikevax)21yr and older 05/24/2022   Fluad Quad(high Dose 65+) 04/04/2019, 05/13/2020, 04/28/2021   Influenza Split 05/07/2014, 05/31/2016   Influenza, High Dose Seasonal PF 06/02/2017, 05/27/2018   Influenza-Unspecified 05/22/2015, 05/12/2022   Moderna SARS-COV2 Booster Vaccination 06/03/2020   PFIZER(Purple Top)SARS-COV-2 Vaccination 08/05/2019, 09/02/2019, 06/03/2020, 03/07/2021   Pneumococcal Conjugate-13 05/13/2020   Pneumococcal Polysaccharide-23 06/28/2016   Tdap 05/18/2011   Zoster Recombinat (Shingrix) 02/12/2020, 05/28/2020      Past Medical History:  Diagnosis Date   Allergy    Hyperlipidemia    Hypertension    Past Surgical History:  Procedure Laterality Date   ABDOMINAL HYSTERECTOMY     ABDOMINAL HYSTERECTOMY N/A    Phreesia 05/10/2020   EYE SURGERY N/A    Phreesia 05/10/2020   Current Outpatient Medications on File Prior to Visit  Medication Sig Dispense Refill   cetirizine (ZYRTEC) 10 MG tablet Take 10 mg by mouth daily as needed for allergies.     citalopram (CELEXA) 20 MG tablet Take 1 tablet (20 mg total) by mouth daily. 60 tablet 0   esomeprazole (NEXIUM) 20 MG capsule TAKE 1 CAPSULE BY MOUTH EVERY DAY AT 12 NOON 90  capsule 3   furosemide (LASIX) 40 MG tablet TAKE 1 TABLET BY MOUTH EVERY DAY 90 tablet 0   hydrochlorothiazide (HYDRODIURIL) 25 MG tablet TAKE 1 TABLET EVERY DAY 90 tablet 3   KLOR-CON M20 20 MEQ tablet TAKE 1 TABLET BY MOUTH EVERY DAY 90 tablet 1   losartan (COZAAR) 100 MG tablet Take 1 tablet (100 mg total) by mouth daily. 60 tablet 0   Melatonin 10 MG TABS Take by mouth.     meloxicam (MOBIC) 15 MG tablet Take 1 tablet (15 mg total) by mouth daily. 60 tablet 0   simvastatin (ZOCOR) 40 MG tablet TAKE 1 (ONE) TABLET EVERY DAY AT BEDTIME. 90 tablet 0   TRAVATAN Z 0.004 % SOLN ophthalmic solution INSTILL 1DROP IN EACH AFFECTED EYE ONCE A DAY (IN THE EVENING) FOR 30 DAYS  12   No current facility-administered medications on file prior to visit.   No Known Allergies Social History   Socioeconomic History   Marital status: Married    Spouse name: EBarkley Bruns  Number of children: 2   Years of education: Not on file   Highest education level: Not on file  Occupational History   Not on file  Tobacco Use   Smoking status: Never   Smokeless tobacco: Never  Vaping Use   Vaping Use: Never used  Substance and Sexual Activity   Alcohol use: Yes    Alcohol/week: 0.0 standard drinks of alcohol    Comment: "wine every now and again"   Drug use: No   Sexual activity: Yes  Other Topics Concern   Not on file  Social History Narrative   2 sons.   3 grand children.   Social Determinants of Health   Financial Resource Strain: Low Risk  (08/17/2022)   Overall Financial Resource Strain (CARDIA)    Difficulty of Paying Living Expenses: Not hard at all  Food Insecurity: No Food Insecurity (08/17/2022)   Hunger Vital Sign    Worried About Running Out of Food in the Last Year: Never true    Ran Out of Food in the Last Year: Never true  Transportation Needs: No Transportation Needs (08/17/2022)   PRAPARE - Hydrologist (Medical): No    Lack of Transportation (Non-Medical):  No  Physical Activity: Sufficiently Active (08/17/2022)   Exercise Vital Sign    Days of Exercise per Week: 5 days    Minutes of Exercise per Session: 30 min  Stress: No Stress Concern Present (08/17/2022)   [place]    Feeling of Stress : Not at all  Social Connections: [place] (08/17/2022)   Social Connection and Isolation Panel [NHANES]    Frequency of Communication with Friends and Family: More than three times a week    Frequency of Social Gatherings with Friends and Family: Three times a week    Attends Religious Services: More than 4 times per year    Active Member of Clubs or Organizations: Yes    Attends Archivist Meetings: More than 4 times per year    Marital Status: Married  Human resources officer Violence: Not At Risk (08/17/2022)   Humiliation, Afraid, Rape, and Kick questionnaire    Fear of Current or Ex-Partner: No    Emotionally Abused: No    Physically Abused: No    Sexually Abused: No   Family History  Problem Relation Age of Onset   Dementia Mother    Seizures Mother    Colon cancer Father    Colon cancer Brother    Breast cancer Paternal Aunt    Esophageal cancer Neg Hx    Stomach cancer Neg Hx    Rectal cancer Neg Hx         Review of Systems  All other systems reviewed and are negative.      Objective:   Physical Exam Vitals reviewed.  Constitutional:      General: She is not in acute distress.    Appearance: She is well-developed. She is not diaphoretic.  HENT:     Head: Normocephalic and atraumatic.     Right Ear: External ear normal.     Left Ear: External ear normal.     Nose: Nose normal.     Mouth/Throat:     Pharynx: No oropharyngeal exudate.  Eyes:     General: No scleral icterus.       Right eye: No discharge.        Left eye: No discharge.     Conjunctiva/sclera: Conjunctivae normal.     Pupils: Pupils are equal, round, and reactive to light.   Neck:     Thyroid: No thyromegaly.     Vascular: No JVD.     Trachea: No tracheal deviation.  Cardiovascular:     Rate and Rhythm: Normal rate and regular rhythm.     Heart sounds: Normal heart sounds. No murmur heard.    No friction rub. No gallop.  Pulmonary:     Effort: Pulmonary effort is normal. No respiratory distress.  Breath sounds: Normal breath sounds. No stridor. No wheezing or rales.  Chest:     Chest wall: No tenderness.  Abdominal:     General: Bowel sounds are normal. There is no distension.     Palpations: Abdomen is soft. There is no mass.     Tenderness: There is no abdominal tenderness. There is no guarding or rebound.  Musculoskeletal:        General: No tenderness. Normal range of motion.     Cervical back: Normal range of motion and neck supple.  Lymphadenopathy:     Cervical: No cervical adenopathy.  Skin:    General: Skin is warm.     Coloration: Skin is not pale.     Findings: No erythema or rash.  Neurological:     Mental Status: She is alert and oriented to person, place, and time.     Cranial Nerves: No cranial nerve deficit.     Motor: No abnormal muscle tone.     Coordination: Coordination normal.     Deep Tendon Reflexes: Reflexes are normal and symmetric.  Psychiatric:        Behavior: Behavior normal.        Thought Content: Thought content normal.        Judgment: Judgment normal.           Assessment & Plan:  Encounter for Medicare annual wellness exam  Benign essential HTN - Plan: CBC with Differential/Platelet, Lipid panel, COMPLETE METABOLIC PANEL WITH GFR  Pure hypercholesterolemia - Plan: CBC with Differential/Platelet, Lipid panel, COMPLETE METABOLIC PANEL WITH GFR Blood pressure today is outstanding.  Check CBC CMP fasting lipid panel.  Goal LDL cholesterol is less than 100.  Mammogram, colonoscopy, and bone density test are up-to-date.  Immunizations are up-to-date including shingles vaccine and both pneumonia vaccines.   Regular anticipatory guidance is provided.  Patient denies any falls, depression, or memory loss.

## 2022-10-13 ENCOUNTER — Ambulatory Visit: Payer: Medicare PPO

## 2022-10-13 LAB — LIPID PANEL
Cholesterol: 159 mg/dL (ref <200)
HDL: 50 mg/dL (ref >=50)
LDL Cholesterol (Calc): 92 mg/dL (calc)
Non-HDL Cholesterol (Calc): 109 mg/dL (calc) (ref <130)
Total CHOL/HDL Ratio: 3.2 (calc) (ref <5.0)
Triglycerides: 79 mg/dL (ref <150)

## 2022-10-13 LAB — CBC WITH DIFFERENTIAL/PLATELET
Absolute Monocytes: 637 cells/uL (ref 200–950)
Basophils Absolute: 20 cells/uL (ref 0–200)
Basophils Relative: 0.2 %
Eosinophils Absolute: 108 cells/uL (ref 15–500)
Eosinophils Relative: 1.1 %
HCT: 39.2 % (ref 35.0–45.0)
Hemoglobin: 12.6 g/dL (ref 11.7–15.5)
Lymphs Abs: 3342 cells/uL (ref 850–3900)
MCH: 29 pg (ref 27.0–33.0)
MCHC: 32.1 g/dL (ref 32.0–36.0)
MCV: 90.1 fL (ref 80.0–100.0)
MPV: 10 fL (ref 7.5–12.5)
Monocytes Relative: 6.5 %
Neutro Abs: 5694 cells/uL (ref 1500–7800)
Neutrophils Relative %: 58.1 %
Platelets: 268 10*3/uL (ref 140–400)
RBC: 4.35 10*6/uL (ref 3.80–5.10)
RDW: 12.5 % (ref 11.0–15.0)
Total Lymphocyte: 34.1 %
WBC: 9.8 10*3/uL (ref 3.8–10.8)

## 2022-10-13 LAB — COMPLETE METABOLIC PANEL WITH GFR
AG Ratio: 1.4 (calc) (ref 1.0–2.5)
ALT: 17 U/L (ref 6–29)
AST: 15 U/L (ref 10–35)
Albumin: 4.2 g/dL (ref 3.6–5.1)
Alkaline phosphatase (APISO): 86 U/L (ref 37–153)
BUN: 17 mg/dL (ref 7–25)
CO2: 27 mmol/L (ref 20–32)
Calcium: 9.2 mg/dL (ref 8.6–10.4)
Chloride: 102 mmol/L (ref 98–110)
Creat: 0.82 mg/dL (ref 0.60–1.00)
Globulin: 2.9 g/dL (calc) (ref 1.9–3.7)
Glucose, Bld: 93 mg/dL (ref 65–99)
Potassium: 3.8 mmol/L (ref 3.5–5.3)
Sodium: 139 mmol/L (ref 135–146)
Total Bilirubin: 0.4 mg/dL (ref 0.2–1.2)
Total Protein: 7.1 g/dL (ref 6.1–8.1)
eGFR: 75 mL/min/{1.73_m2} (ref >=60)

## 2022-11-03 ENCOUNTER — Other Ambulatory Visit: Payer: Self-pay | Admitting: Family Medicine

## 2022-11-03 DIAGNOSIS — E7800 Pure hypercholesterolemia, unspecified: Secondary | ICD-10-CM

## 2022-11-13 ENCOUNTER — Ambulatory Visit: Payer: Medicare PPO | Admitting: Family Medicine

## 2022-11-13 ENCOUNTER — Encounter: Payer: Self-pay | Admitting: Family Medicine

## 2022-11-13 VITALS — BP 132/82 | HR 83 | Temp 98.5°F | Ht 61.0 in | Wt 172.2 lb

## 2022-11-13 DIAGNOSIS — J302 Other seasonal allergic rhinitis: Secondary | ICD-10-CM

## 2022-11-13 MED ORDER — PREDNISONE 20 MG PO TABS: ORAL_TABLET | ORAL | 0 refills | Status: AC

## 2022-11-13 NOTE — Progress Notes (Signed)
Subjective:    Patient ID: Taylor Holmes, female    DOB: Mar 15, 1951, 72 y.o.   MRN: [id number]  HPI  Patient reports a 1 week history of postnasal drip, laryngitis, sinusitis, and a cough productive of yellow sputum.  She denies any chest pain.  She denies any shortness of breath.  She denies any pleurisy or hemoptysis.  She denies any sinus pain.  She denies any headaches.  She is taking Allegra with no benefit. Past Medical History:  Diagnosis Date   Allergy    Hyperlipidemia    Hypertension    Past Surgical History:  Procedure Laterality Date   ABDOMINAL HYSTERECTOMY     ABDOMINAL HYSTERECTOMY N/A    [employer]reesia 05/10/2020   EYE SURGERY N/A    [employer] 05/10/2020   Current Outpatient Medications on File Prior to Visit  Medication Sig Dispense Refill   cetirizine (ZYRTEC) 10 MG tablet Take 10 mg by mouth daily as needed for allergies.     citalopram (CELEXA) 20 MG tablet TAKE 1 TABLET EVERY DAY 90 tablet 3   esomeprazole (NEXIUM) 20 MG capsule TAKE 1 CAPSULE BY MOUTH EVERY DAY AT 12 NOON 90 capsule 3   furosemide (LASIX) 40 MG tablet TAKE 1 TABLET BY MOUTH EVERY DAY 90 tablet 0   hydrochlorothiazide (HYDRODIURIL) 25 MG tablet TAKE 1 TABLET EVERY DAY 90 tablet 3   KLOR-CON M20 20 MEQ tablet TAKE 1 TABLET BY MOUTH EVERY DAY 90 tablet 1   losartan (COZAAR) 100 MG tablet Take 1 tablet (100 mg total) by mouth daily. 60 tablet 0   Melatonin 10 MG TABS Take by mouth.     meloxicam (MOBIC) 15 MG tablet TAKE 1 TABLET EVERY DAY 90 tablet 3   simvastatin (ZOCOR) 40 MG tablet TAKE 1 TABLET EVERY DAY AT BEDTIME. NEED OFFICE VISIT 90 tablet 3   TRAVATAN Z 0.004 % SOLN ophthalmic solution INSTILL 1DROP IN EACH AFFECTED EYE ONCE A DAY (IN THE EVENING) FOR 30 DAYS  12   No current facility-administered medications on file prior to visit.   No Known Allergies Social History   Socioeconomic History   Marital status: Married    Spouse name: Jules Husbands   Number of children: 2   Years of  education: Not on file   Highest education level: Not on file  Occupational History   Not on file  Tobacco Use   Smoking status: Never   Smokeless tobacco: Never  Vaping Use   Vaping Use: Never used  Substance and Sexual Activity   Alcohol use: Yes    Alcohol/week: 0.0 standard drinks of alcohol    Comment: "wine every now and again"   Drug use: No   Sexual activity: Yes  Other Topics Concern   Not on file  Social History Narrative   2 sons.   3 grand children.   Social Determinants of Health   Financial Resource Strain: Low Risk  (08/17/2022)   Overall Financial Resource Strain (CARDIA)    Difficulty of Paying Living Expenses: Not hard at all  Food Insecurity: No Food Insecurity (08/17/2022)   Hunger Vital Sign    Worried About Running Out of Food in the Last Year: Never true    Ran Out of Food in the Last Year: Never true  Transportation Needs: No Transportation Needs (08/17/2022)   PRAPARE - Administrator, Civil Service (Medical): No    Lack of Transportation (Non-Medical): No  Physical Activity: Sufficiently Active (08/17/2022)  Exercise Vital Sign    Days of Exercise per Week: 5 days    Minutes of Exercise per Session: 30 min  Stress: No Stress Concern Present (08/17/2022)   [employer] of Occupational Health - Occupational Stress Questionnaire    Feeling of Stress : Not at all  Social Connections: Socially Integrated (08/17/2022)   Social Connection and Isolation Panel [NHANES]    Frequency of Communication with Friends and Family: More than three times a week    Frequency of Social Gatherings with Friends and Family: Three times a week    Attends Religious Services: More than 4 times per year    Active Member of Clubs or Organizations: Yes    Attends Banker Meetings: More than 4 times per year    Marital Status: Married  Catering manager Violence: Not At Risk (08/17/2022)   Humiliation, Afraid, Rape, and Kick questionnaire    Fear of  Current or Ex-Partner: No    Emotionally Abused: No    Physically Abused: No    Sexually Abused: No     Review of Systems  Constitutional:  Negative for chills and fatigue.  HENT:  Positive for congestion, rhinorrhea, sneezing and sore throat. Negative for sinus pressure and sinus pain.   Eyes:  Negative for discharge.  Respiratory:  Positive for cough. Negative for wheezing and stridor.   Cardiovascular:  Negative for chest pain.       Objective:   Physical Exam Vitals reviewed.  Constitutional:      Appearance: Normal appearance. She is normal weight. She is not ill-appearing.  HENT:     Right Ear: Tympanic membrane and ear canal normal.     Left Ear: Tympanic membrane and ear canal normal.     Nose: Congestion and rhinorrhea present.     Mouth/Throat:     Mouth: Mucous membranes are moist.     Pharynx: Oropharynx is clear. No oropharyngeal exudate or posterior oropharyngeal erythema.  Cardiovascular:     Rate and Rhythm: Normal rate and regular rhythm.  Pulmonary:     Effort: Pulmonary effort is normal. No respiratory distress.     Breath sounds: Normal breath sounds. No stridor. No wheezing, rhonchi or rales.  Lymphadenopathy:     Cervical: No cervical adenopathy.  Neurological:     Mental Status: She is alert.           Assessment & Plan:  Seasonal allergic rhinitis, unspecified trigger Begin a prednisone taper pack due to the severity of her allergies.  Simultaneously add Flonase to the Allegra.  After 6 days hopefully the Flonase will be able to maintain control of her allergies once the prednisone taper resolved

## 2022-12-31 ENCOUNTER — Other Ambulatory Visit: Payer: Self-pay | Admitting: Family Medicine

## 2023-01-02 NOTE — Telephone Encounter (Signed)
Requested Prescriptions  Pending Prescriptions Disp Refills   hydrochlorothiazide (HYDRODIURIL) 25 MG tablet [Pharmacy Med Name: HYDROCHLOROTHIAZIDE 25 MG Tablet] 90 tablet 3    Sig: TAKE 1 TABLET EVERY DAY     Cardiovascular: Diuretics - Thiazide Failed - 12/31/2022  7:38 AM      Failed - Valid encounter within last 6 months    Recent Outpatient Visits           1 year ago Dyspnea, unspecified type   [facility] Donita Brooks, MD   1 year ago Dysequilibrium   [facility] Donita Brooks, MD   2 years ago Postmenopausal estrogen deficiency   [facility] Pickard, Priscille Heidelberg, MD   4 years ago Radiculopathy, unspecified spinal region   [facility] Pickard, Priscille Heidelberg, MD   5 years ago Mid back pain   [facility] Pickard, Priscille Heidelberg, MD       Future Appointments             In 9 months Pickard, Priscille Heidelberg, MD [facility], [facility]            Passed - Cr in normal range and within 180 days    Creat  Date Value Ref Range Status  10/12/2022 0.82 0.60 - 1.00 mg/dL Final         Passed - K in normal range and within 180 days    Potassium  Date Value Ref Range Status  10/12/2022 3.8 3.5 - 5.3 mmol/L Final         Passed - Na in normal range and within 180 days    Sodium  Date Value Ref Range Status  10/12/2022 139 135 - 146 mmol/L Final         Passed - Last BP in normal range    BP Readings from Last 1 Encounters:  11/13/22 132/82

## 2023-01-05 ENCOUNTER — Other Ambulatory Visit: Payer: Self-pay | Admitting: Family Medicine

## 2023-01-05 MED ORDER — LOSARTAN POTASSIUM 100 MG PO TABS: 100.0000 mg | ORAL_TABLET | Freq: Every day | ORAL | 1 refills | Status: DC

## 2023-01-05 NOTE — Telephone Encounter (Signed)
Requested Prescriptions  Pending Prescriptions Disp Refills   losartan (COZAAR) 100 MG tablet 60 tablet 0    Sig: Take 1 tablet (100 mg total) by mouth daily.     Cardiovascular:  Angiotensin Receptor Blockers Failed - 01/05/2023 10:59 AM      Failed - Valid encounter within last 6 months    Recent Outpatient Visits           1 year ago Dyspnea, unspecified type   [facility] Medicine Pickard, Priscille Heidelberg, MD   1 year ago Dysequilibrium   [facility] Medicine Tanya Nones, Priscille Heidelberg, MD   2 years ago Postmenopausal estrogen deficiency   [facility] Medicine Donita Brooks, MD   4 years ago Radiculopathy, unspecified spinal region   [facility] Medicine Pickard, Priscille Heidelberg, MD   5 years ago Mid back pain   [facility] Medicine Pickard, Priscille Heidelberg, MD       Future Appointments             In 9 months Pickard, Priscille Heidelberg, MD [facility]            Passed - Cr in normal range and within 180 days    Creat  Date Value Ref Range Status  10/12/2022 0.82 0.60 - 1.00 mg/dL Final         Passed - K in normal range and within 180 days    Potassium  Date Value Ref Range Status  10/12/2022 3.8 3.5 - 5.3 mmol/L Final         Passed - Patient is not pregnant      Passed - Last BP in normal range    BP Readings from Last 1 Encounters:  11/13/22 132/82

## 2023-01-05 NOTE — Telephone Encounter (Signed)
Prescription Request  01/05/2023  LOV: 11/13/2022  What is the name of the medication or equipment?   losartan (COZAAR) 100 MG tablet  **90 day script requested**  Have you contacted your pharmacy to request a refill? Yes   Which pharmacy would you like this sent to?  [id number] - [place], [place] - [address] AT [facility] CENTER [address] [place] [address] Phone: [phone] Fax: [phone]    Patient notified that their request is being sent to the clinical staff for review and that they should receive a response within 2 business days.   Please advise pharmacist.

## 2023-06-22 DIAGNOSIS — H25012 Cortical age-related cataract, left eye: Secondary | ICD-10-CM | POA: Diagnosis not present

## 2023-06-22 DIAGNOSIS — H401131 Primary open-angle glaucoma, bilateral, mild stage: Secondary | ICD-10-CM | POA: Diagnosis not present

## 2023-06-29 ENCOUNTER — Other Ambulatory Visit: Payer: Self-pay | Admitting: Family Medicine

## 2023-06-29 DIAGNOSIS — H5202 Hypermetropia, left eye: Secondary | ICD-10-CM | POA: Diagnosis not present

## 2023-06-29 DIAGNOSIS — H25012 Cortical age-related cataract, left eye: Secondary | ICD-10-CM | POA: Diagnosis not present

## 2023-06-29 DIAGNOSIS — H5211 Myopia, right eye: Secondary | ICD-10-CM | POA: Diagnosis not present

## 2023-06-29 DIAGNOSIS — H401131 Primary open-angle glaucoma, bilateral, mild stage: Secondary | ICD-10-CM | POA: Diagnosis not present

## 2023-06-29 NOTE — Telephone Encounter (Signed)
Last OV 11/13/22 Requested Prescriptions  Pending Prescriptions Disp Refills   esomeprazole (NEXIUM) 20 MG capsule [Pharmacy Med Name: ESOMEPRAZOLE MAG DR 20 MG CAP] 90 capsule 3    Sig: TAKE 1 CAPSULE BY MOUTH EVERY DAY AT 12 NOON     Gastroenterology: Proton Pump Inhibitors 2 Failed - 06/29/2023  2:31 AM      Failed - Valid encounter within last 12 months    Recent Outpatient Visits           1 year ago Dyspnea, unspecified type   [facility] Medicine Pickard, Priscille Heidelberg, MD   2 years ago Dysequilibrium   [facility] Medicine Tanya Nones, Priscille Heidelberg, MD   3 years ago Postmenopausal estrogen deficiency   [facility] Medicine Tanya Nones, Priscille Heidelberg, MD   5 years ago Radiculopathy, unspecified spinal region   [facility] Medicine Pickard, Priscille Heidelberg, MD   5 years ago Mid back pain   [facility] Pickard, Priscille Heidelberg, MD       Future Appointments             In 3 months Pickard, Priscille Heidelberg, MD [facility], [facility]            Passed - ALT in normal range and within 360 days    ALT  Date Value Ref Range Status  10/12/2022 17 6 - 29 U/L Final         Passed - AST in normal range and within 360 days    AST  Date Value Ref Range Status  10/12/2022 15 10 - 35 U/L Final

## 2023-07-02 ENCOUNTER — Other Ambulatory Visit: Payer: Self-pay

## 2023-07-02 NOTE — Telephone Encounter (Signed)
Prescription Request  07/02/2023  LOV: 11/13/22  What is the name of the medication or equipment? furosemide (LASIX) 40 MG tablet [[id number]]   Have you contacted your pharmacy to request a refill? Yes   Which pharmacy would you like this sent to?  [id number] [place], [place] - [address] AT [facility] CENTER [address] [place] [address] Phone: [phone] Fax: [phone]    Patient notified that their request is being sent to the clinical staff for review and that they should receive a response within 2 business days.   Please advise at [facility] [phone]

## 2023-07-03 NOTE — Telephone Encounter (Signed)
Requested medications are due for refill today.  yes  Requested medications are on the active medications list.  yes  Last refill. 04/20/2022 #90 0 rf  Future visit scheduled.   Yes in march 2025  Notes to clinic.  Pt last CPE march 2024, seen 1 other time. Please review for refill.    Requested Prescriptions  Pending Prescriptions Disp Refills   furosemide (LASIX) 40 MG tablet 90 tablet 0    Sig: Take 1 tablet (40 mg total) by mouth daily.     Cardiovascular:  Diuretics - Loop Failed - 07/02/2023  1:31 PM      Failed - K in normal range and within 180 days    Potassium  Date Value Ref Range Status  10/12/2022 3.8 3.5 - 5.3 mmol/L Final         Failed - Ca in normal range and within 180 days    Calcium  Date Value Ref Range Status  10/12/2022 9.2 8.6 - 10.4 mg/dL Final         Failed - Na in normal range and within 180 days    Sodium  Date Value Ref Range Status  10/12/2022 139 135 - 146 mmol/L Final         Failed - Cr in normal range and within 180 days    Creat  Date Value Ref Range Status  10/12/2022 0.82 0.60 - 1.00 mg/dL Final         Failed - Cl in normal range and within 180 days    Chloride  Date Value Ref Range Status  10/12/2022 102 98 - 110 mmol/L Final         Failed - Mg Level in normal range and within 180 days    No results found for: "MG"       Failed - Valid encounter within last 6 months    Recent Outpatient Visits           1 year ago Dyspnea, unspecified type   [facility] Donita Brooks, MD   2 years ago Dysequilibrium   [facility] Donita Brooks, MD   3 years ago Postmenopausal estrogen deficiency   [facility] Medicine Donita Brooks, MD   5 years ago Radiculopathy, unspecified spinal region   [facility] Medicine Pickard, Priscille Heidelberg, MD   5 years ago Mid back pain   [facility] Pickard, Priscille Heidelberg, MD       Future Appointments             In 3 months  Pickard, Priscille Heidelberg, MD [facility], [facility]            Passed - Last BP in normal range    BP Readings from Last 1 Encounters:  11/13/22 132/82

## 2023-07-16 MED ORDER — FUROSEMIDE 40 MG PO TABS: 40.0000 mg | ORAL_TABLET | Freq: Every day | ORAL | 0 refills | Status: DC

## 2023-07-26 ENCOUNTER — Other Ambulatory Visit: Payer: Self-pay

## 2023-07-26 MED ORDER — LOSARTAN POTASSIUM 100 MG PO TABS: 100.0000 mg | ORAL_TABLET | Freq: Every day | ORAL | 0 refills | Status: AC

## 2023-07-26 NOTE — Telephone Encounter (Signed)
Prescription Request  07/26/2023  LOV: 11/13/22 UPCOMING CPE 10/12/22  What is the name of the medication or equipment? losartan (COZAAR) 100 MG tablet [[id number]]   Have you contacted your pharmacy to request a refill? Yes   Which pharmacy would you like this sent to?  [id number] [place], [place] - [address] AT [facility] CENTER [address] [place] [address] Phone: [phone] Fax: [phone]    Patient notified that their request is being sent to the clinical staff for review and that they should receive a response within 2 business days.   Please advise at [facility] [phone]

## 2023-07-26 NOTE — Telephone Encounter (Signed)
Last office visit 10/17/22 Requested Prescriptions  Pending Prescriptions Disp Refills   losartan (COZAAR) 100 MG tablet 90 tablet 1    Sig: Take 1 tablet (100 mg total) by mouth daily.     Cardiovascular:  Angiotensin Receptor Blockers Failed - 07/26/2023  1:45 PM      Failed - Cr in normal range and within 180 days    Creat  Date Value Ref Range Status  10/12/2022 0.82 0.60 - 1.00 mg/dL Final         Failed - K in normal range and within 180 days    Potassium  Date Value Ref Range Status  10/12/2022 3.8 3.5 - 5.3 mmol/L Final         Failed - Valid encounter within last 6 months    Recent Outpatient Visits           1 year ago Dyspnea, unspecified type   [facility] Medicine Donita Brooks, MD   2 years ago Dysequilibrium   [facility] Medicine Tanya Nones, Priscille Heidelberg, MD   3 years ago Postmenopausal estrogen deficiency   [facility] Tanya Nones, Priscille Heidelberg, MD   5 years ago Radiculopathy, unspecified spinal region   [facility] Medicine Pickard, Priscille Heidelberg, MD   5 years ago Mid back pain   [facility] Medicine Pickard, Priscille Heidelberg, MD       Future Appointments             In 2 months Pickard, Priscille Heidelberg, MD [facility], [facility]            Passed - Patient is not pregnant      Passed - Last BP in normal range    BP Readings from Last 1 Encounters:  11/13/22 132/82

## 2023-07-30 ENCOUNTER — Other Ambulatory Visit: Payer: Self-pay

## 2023-07-30 ENCOUNTER — Telehealth: Payer: Self-pay

## 2023-07-30 MED ORDER — FUROSEMIDE 40 MG PO TABS: 40.0000 mg | ORAL_TABLET | Freq: Every day | ORAL | 0 refills | Status: DC

## 2023-07-30 NOTE — Telephone Encounter (Signed)
Prescription Request  07/30/2023  LOV: 11/13/22  What is the name of the medication or equipment? furosemide (LASIX) 40 MG tablet [[id number]]   Have you contacted your pharmacy to request a refill? Yes   Which pharmacy would you like this sent to?  [id number] - [place], [place] - [address] AT [facility] CENTER [address] [place]  [address] Phone: [phone] Fax: [phone]    Patient notified that their request is being sent to the clinical staff for review and that they should receive a response within 2 business days.   Please advise at [facility] [phone]

## 2023-08-02 ENCOUNTER — Other Ambulatory Visit: Payer: Self-pay

## 2023-08-02 DIAGNOSIS — R0600 Dyspnea, unspecified: Secondary | ICD-10-CM

## 2023-08-02 DIAGNOSIS — I10 Essential (primary) hypertension: Secondary | ICD-10-CM

## 2023-08-02 MED ORDER — FUROSEMIDE 40 MG PO TABS
40.0000 mg | ORAL_TABLET | Freq: Every day | ORAL | 1 refills | Status: AC
Start: 2023-08-02 — End: ?

## 2023-08-02 NOTE — Telephone Encounter (Signed)
Pharmacy sent script to request a 90 day supply of furosemide (LASIX) 40 MG tablet . Please advise pharmacist.

## 2023-08-22 ENCOUNTER — Other Ambulatory Visit: Payer: Self-pay | Admitting: Family Medicine

## 2023-08-22 DIAGNOSIS — E7800 Pure hypercholesterolemia, unspecified: Secondary | ICD-10-CM

## 2023-08-23 ENCOUNTER — Ambulatory Visit: Payer: Medicare PPO | Admitting: *Deleted

## 2023-08-23 DIAGNOSIS — Z780 Asymptomatic menopausal state: Secondary | ICD-10-CM | POA: Diagnosis not present

## 2023-08-23 DIAGNOSIS — Z0000 Encounter for general adult medical examination without abnormal findings: Secondary | ICD-10-CM | POA: Diagnosis not present

## 2023-08-23 NOTE — Patient Instructions (Signed)
Taylor Holmes , Thank you for taking time to come for your Medicare Wellness Visit. I appreciate your ongoing commitment to your health goals. Please review the following plan we discussed and let me know if I can assist you in the future.   Screening recommendations/referrals: Colonoscopy: up to date Mammogram: up to date Bone Density: ordered Recommended yearly ophthalmology/optometry visit for glaucoma screening and checkup Recommended yearly dental visit for hygiene and checkup  Vaccinations: Influenza vaccine: unable to find documentation  Pneumococcal vaccine: up tod ate Tdap vaccine: Education provided Shingles vaccine: up to date       Preventive Care 65 Years and Older, Female Preventive care refers to lifestyle choices and visits with your health care provider that can promote health and wellness. What does preventive care include? A yearly physical exam. This is also called an annual well check. Dental exams once or twice a year. Routine eye exams. Ask your health care provider how often you should have your eyes checked. Personal lifestyle choices, including: Daily care of your teeth and gums. Regular physical activity. Eating a healthy diet. Avoiding tobacco and drug use. Limiting alcohol use. Practicing safe sex. Taking low-dose aspirin every day. Taking vitamin and mineral supplements as recommended by your health care provider. What happens during an annual well check? The services and screenings done by your health care provider during your annual well check will depend on your age, overall health, lifestyle risk factors, and family history of disease. Counseling  Your health care provider may ask you questions about your: Alcohol use. Tobacco use. Drug use. Emotional well-being. Home and relationship well-being. Sexual activity. Eating habits. History of falls. Memory and ability to understand (cognition). Work and work Astronomer. Reproductive  health. Screening  You may have the following tests or measurements: Height, weight, and BMI. Blood pressure. Lipid and cholesterol levels. These may be checked every 5 years, or more frequently if you are over 67 years old. Skin check. Lung cancer screening. You may have this screening every year starting at age 42 if you have a 30-pack-year history of smoking and currently smoke or have quit within the past 15 years. Fecal occult blood test (FOBT) of the stool. You may have this test every year starting at age 41. Flexible sigmoidoscopy or colonoscopy. You may have a sigmoidoscopy every 5 years or a colonoscopy every 10 years starting at age 56. Hepatitis C blood test. Hepatitis B blood test. Sexually transmitted disease (STD) testing. Diabetes screening. This is done by checking your blood sugar (glucose) after you have not eaten for a while (fasting). You may have this done every 1-3 years. Bone density scan. This is done to screen for osteoporosis. You may have this done starting at age 74. Mammogram. This may be done every 1-2 years. Talk to your health care provider about how often you should have regular mammograms. Talk with your health care provider about your test results, treatment options, and if necessary, the need for more tests. Vaccines  Your health care provider may recommend certain vaccines, such as: Influenza vaccine. This is recommended every year. Tetanus, diphtheria, and acellular pertussis (Tdap, Td) vaccine. You may need a Td booster every 10 years. Zoster vaccine. You may need this after age 37. Pneumococcal 13-valent conjugate (PCV13) vaccine. One dose is recommended after age 37. Pneumococcal polysaccharide (PPSV23) vaccine. One dose is recommended after age 46. Talk to your health care provider about which screenings and vaccines you need and how often you need them. This  information is not intended to replace advice given to you by your health care provider.  Make sure you discuss any questions you have with your health care provider. Document Released: 08/20/2015 Document Revised: 04/12/2016 Document Reviewed: 05/25/2015 Elsevier Interactive Patient Education  2017 [employer].  Fall Prevention in the Home Falls can cause injuries. They can happen to people of all ages. There are many things you can do to make your home safe and to help prevent falls. What can I do on the outside of my home? Regularly fix the edges of walkways and driveways and fix any cracks. Remove anything that might make you trip as you walk through a door, such as a raised step or threshold. Trim any bushes or trees on the path to your home. Use bright outdoor lighting. Clear any walking paths of anything that might make someone trip, such as rocks or tools. Regularly check to see if handrails are loose or broken. Make sure that both sides of any steps have handrails. Any raised decks and porches should have guardrails on the edges. Have any leaves, snow, or ice cleared regularly. Use sand or salt on walking paths during winter. Clean up any spills in your garage right away. This includes oil or grease spills. What can I do in the bathroom? Use night lights. Install grab bars by the toilet and in the tub and shower. Do not use towel bars as grab bars. Use non-skid mats or decals in the tub or shower. If you need to sit down in the shower, use a plastic, non-slip stool. Keep the floor dry. Clean up any water that spills on the floor as soon as it happens. Remove soap buildup in the tub or shower regularly. Attach bath mats securely with double-sided non-slip rug tape. Do not have throw rugs and other things on the floor that can make you trip. What can I do in the bedroom? Use night lights. Make sure that you have a light by your bed that is easy to reach. Do not use any sheets or blankets that are too big for your bed. They should not hang down onto the floor. Have a  firm chair that has side arms. You can use this for support while you get dressed. Do not have throw rugs and other things on the floor that can make you trip. What can I do in the kitchen? Clean up any spills right away. Avoid walking on wet floors. Keep items that you use a lot in easy-to-reach places. If you need to reach something above you, use a strong step stool that has a grab bar. Keep electrical cords out of the way. Do not use floor polish or wax that makes floors slippery. If you must use wax, use non-skid floor wax. Do not have throw rugs and other things on the floor that can make you trip. What can I do with my stairs? Do not leave any items on the stairs. Make sure that there are handrails on both sides of the stairs and use them. Fix handrails that are broken or loose. Make sure that handrails are as long as the stairways. Check any carpeting to make sure that it is firmly attached to the stairs. Fix any carpet that is loose or worn. Avoid having throw rugs at the top or bottom of the stairs. If you do have throw rugs, attach them to the floor with carpet tape. Make sure that you have a light switch at the top  of the stairs and the bottom of the stairs. If you do not have them, ask someone to add them for you. What else can I do to help prevent falls? Wear shoes that: Do not have high heels. Have rubber bottoms. Are comfortable and fit you well. Are closed at the toe. Do not wear sandals. If you use a stepladder: Make sure that it is fully opened. Do not climb a closed stepladder. Make sure that both sides of the stepladder are locked into place. Ask someone to hold it for you, if possible. Clearly mark and make sure that you can see: Any grab bars or handrails. First and last steps. Where the edge of each step is. Use tools that help you move around (mobility aids) if they are needed. These include: Canes. Walkers. Scooters. Crutches. Turn on the lights when you  go into a dark area. Replace any light bulbs as soon as they burn out. Set up your furniture so you have a clear path. Avoid moving your furniture around. If any of your floors are uneven, fix them. If there are any pets around you, be aware of where they are. Review your medicines with your doctor. Some medicines can make you feel dizzy. This can increase your chance of falling. Ask your doctor what other things that you can do to help prevent falls. This information is not intended to replace advice given to you by your health care provider. Make sure you discuss any questions you have with your health care provider. Document Released: 05/20/2009 Document Revised: 12/30/2015 Document Reviewed: 08/28/2014 Elsevier Interactive Patient Education  2017 [employer].

## 2023-08-23 NOTE — Progress Notes (Signed)
Subjective:   Taylor Holmes is a 73 y.o. female who presents for Medicare Annual (Subsequent) preventive examination.  Visit Complete: Virtual I connected with  Concha Se on 08/23/23 by a audio enabled telemedicine application and verified that I am speaking with the correct person using two identifiers.  Patient Location: Home  Provider Location: Home Office  I discussed the limitations of evaluation and management by telemedicine. The patient expressed understanding and agreed to proceed.  Vital Signs: Because this visit was a virtual/telehealth visit, some criteria may be missing or patient reported. Any vitals not documented were not able to be obtained and vitals that have been documented are patient reported.  Patient unable to connect video  Cardiac Risk Factors include: advanced age (>65men, >41 women)     Objective:    There were no vitals filed for this visit. There is no height or weight on file to calculate BMI.     08/23/2023   11:46 AM 08/17/2022   12:25 PM 08/11/2021   12:16 PM 11/26/2015   11:10 AM  Advanced Directives  Does Patient Have a Medical Advance Directive? No No No No  Would patient like information on creating a medical advance directive?  No - Patient declined No - Patient declined No - patient declined information    Current Medications (verified) Outpatient Encounter Medications as of 08/23/2023  Medication Sig   cetirizine (ZYRTEC) 10 MG tablet Take 10 mg by mouth daily as needed for allergies.   citalopram (CELEXA) 20 MG tablet TAKE 1 TABLET EVERY DAY   esomeprazole (NEXIUM) 20 MG capsule TAKE 1 CAPSULE BY MOUTH EVERY DAY AT 12 NOON   furosemide (LASIX) 40 MG tablet Take 1 tablet (40 mg total) by mouth daily.   hydrochlorothiazide (HYDRODIURIL) 25 MG tablet TAKE 1 TABLET EVERY DAY   KLOR-CON M20 20 MEQ tablet TAKE 1 TABLET BY MOUTH EVERY DAY   losartan (COZAAR) 100 MG tablet Take 1 tablet (100 mg total) by mouth daily.    Melatonin 10 MG TABS Take by mouth.   meloxicam (MOBIC) 15 MG tablet TAKE 1 TABLET EVERY DAY   predniSONE (DELTASONE) 20 MG tablet 3 tabs poqday 1-2, 2 tabs poqday 3-4, 1 tab poqday 5-6   simvastatin (ZOCOR) 40 MG tablet TAKE 1 TABLET EVERY DAY AT BEDTIME. NEED OFFICE VISIT   TRAVATAN Z 0.004 % SOLN ophthalmic solution INSTILL 1DROP IN EACH AFFECTED EYE ONCE A DAY (IN THE EVENING) FOR 30 DAYS   No facility-administered encounter medications on file as of 08/23/2023.    Allergies (verified) Patient has no known allergies.   History: Past Medical History:  Diagnosis Date   Allergy    Hyperlipidemia    Hypertension    Past Surgical History:  Procedure Laterality Date   ABDOMINAL HYSTERECTOMY     ABDOMINAL HYSTERECTOMY N/A    Phreesia 05/10/2020   EYE SURGERY N/A    Phreesia 05/10/2020   Family History  Problem Relation Age of Onset   Dementia Mother    Seizures Mother    Colon cancer Father    Colon cancer Brother    Breast cancer Paternal Aunt    Esophageal cancer Neg Hx    Stomach cancer Neg Hx    Rectal cancer Neg Hx    Social History   Socioeconomic History   Marital status: Married    Spouse name: Jules Husbands   Number of children: 2   Years of education: Not on file   Highest education level:  Not on file  Occupational History   Not on file  Tobacco Use   Smoking status: Never   Smokeless tobacco: Never  Vaping Use   Vaping status: Never Used  Substance and Sexual Activity   Alcohol use: Yes    Alcohol/week: 0.0 standard drinks of alcohol    Comment: "wine every now and again"   Drug use: No   Sexual activity: Yes  Other Topics Concern   Not on file  Social History Narrative   2 sons.   3 grand children.   Social Drivers of Corporate investment banker Strain: Low Risk  (08/23/2023)   Overall Financial Resource Strain (CARDIA)    Difficulty of Paying Living Expenses: Not hard at all  Food Insecurity: No Food Insecurity (08/23/2023)   Hunger Vital Sign     Worried About Running Out of Food in the Last Year: Never true    Ran Out of Food in the Last Year: Never true  Transportation Needs: No Transportation Needs (08/23/2023)   PRAPARE - Administrator, Civil Service (Medical): No    Lack of Transportation (Non-Medical): No  Physical Activity: Sufficiently Active (08/23/2023)   Exercise Vital Sign    Days of Exercise per Week: 5 days    Minutes of Exercise per Session: 30 min  Stress: No Stress Concern Present (08/23/2023)   [employer] of Occupational Health - Occupational Stress Questionnaire    Feeling of Stress : Not at all  Social Connections: Socially Integrated (08/23/2023)   Social Connection and Isolation Panel [NHANES]    Frequency of Communication with Friends and Family: More than three times a week    Frequency of Social Gatherings with Friends and Family: Three times a week    Attends Religious Services: More than 4 times per year    Active Member of Clubs or Organizations: Yes    Attends Engineer, structural: More than 4 times per year    Marital Status: Married    Tobacco Counseling Counseling given: Not Answered   Clinical Intake:  Pre-visit preparation completed: Yes  Pain : No/denies pain     Diabetes: No  How often do you need to have someone help you when you read instructions, pamphlets, or other written materials from your doctor or pharmacy?: 1 - Never  Interpreter Needed?: No  Information entered by :: Remi Haggard LPN   Activities of Daily Living    08/23/2023   11:47 AM  In your present state of health, do you have any difficulty performing the following activities:  Hearing? 1  Vision? 0  Difficulty concentrating or making decisions? 0  Walking or climbing stairs? 0  Dressing or bathing? 0  Doing errands, shopping? 0  Preparing Food and eating ? N  In the past six months, have you accidently leaked urine? N  Do you have problems with loss of bowel control? N   Managing your Medications? N  Managing your Finances? N  Housekeeping or managing your Housekeeping? N    Patient Care Team: Donita Brooks, MD as PCP - General (Family Medicine) [place], [facility] (Ophthalmology) Meryl Dare, MD (Inactive) as Consulting Physician (Gastroenterology)  Indicate any recent Medical Services you may have received from other than [facility] providers in the past year (date may be approximate).     Assessment:   This is a routine wellness examination for Harvie.  Hearing/Vision screen Hearing Screening - Comments:: Bilateral hearing aids Vision Screening - Comments::  [facility]    Goals Addressed             This Visit's Progress    Patient Stated       Stay alive       Depression Screen    08/23/2023   11:59 AM 08/17/2022   12:39 PM 08/11/2021   12:12 PM 05/13/2020    9:27 AM 08/16/2017   11:34 AM  PHQ 2/9 Scores  PHQ - 2 Score 2 0 1 0 0  PHQ- 9 Score 2        Fall Risk    08/23/2023   11:47 AM 08/17/2022   12:20 PM 08/11/2021   12:17 PM 05/13/2020    9:27 AM 08/16/2017   11:34 AM  Fall Risk   Falls in the past year? 0 0 0 0 No  Number falls in past yr: 0 0 0 0   Injury with Fall? 0 0 0 0   Risk for fall due to :  No Fall Risks No Fall Risks    Follow up Falls evaluation completed;Education provided;Falls prevention discussed Falls evaluation completed;Education provided;Falls prevention discussed Falls prevention discussed      MEDICARE RISK AT HOME: Medicare Risk at Home Any stairs in or around the home?: No If so, are there any without handrails?: No Home free of loose throw rugs in walkways, pet beds, electrical cords, etc?: Yes Adequate lighting in your home to reduce risk of falls?: Yes Life alert?: No Use of a cane, walker or w/c?: No Grab bars in the bathroom?: No Shower chair or bench in shower?: Yes Elevated toilet seat or a handicapped toilet?: No  TIMED UP AND GO:  Was the  test performed?  No    Cognitive Function:        08/23/2023   11:48 AM 08/17/2022   12:33 PM 08/11/2021   12:20 PM 05/13/2020    9:28 AM  6CIT Screen  What Year? 0 points 0 points 0 points 0 points  What month? 0 points 0 points 0 points 0 points  What time? 0 points 0 points 0 points 0 points  Count back from 20 0 points 0 points 0 points 0 points  Months in reverse 0 points 0 points 0 points 0 points  Repeat phrase 0 points 0 points 0 points 0 points  Total Score 0 points 0 points 0 points 0 points    Immunizations Immunization History  Administered Date(s) Administered   Fluad Quad(high Dose 65+) 04/04/2019, 05/13/2020, 04/28/2021   Influenza Split 05/07/2014, 05/31/2016   Influenza, High Dose Seasonal PF 06/02/2017, 05/27/2018   Influenza-Unspecified 05/22/2015, 05/12/2022   Moderna Covid-19 Fall Seasonal Vaccine 21yrs & older 05/24/2022, 05/22/2023   Moderna SARS-COV2 Booster Vaccination 06/03/2020   PFIZER(Purple Top)SARS-COV-2 Vaccination 08/05/2019, 09/02/2019, 06/03/2020, 03/07/2021   Pneumococcal Conjugate-13 05/13/2020   Pneumococcal Polysaccharide-23 06/28/2016   Tdap 05/18/2011   Zoster Recombinant(Shingrix) 02/12/2020, 05/28/2020    TDAP status: Due, Education has been provided regarding the importance of this vaccine. Advised may receive this vaccine at local pharmacy or Health Dept. Aware to provide a copy of the vaccination record if obtained from local pharmacy or Health Dept. Verbalized acceptance and understanding.  Flu Vaccine status: Up to date  Pneumococcal vaccine status: Up to date  Covid-19 vaccine status: Information provided on how to obtain vaccines.   Qualifies for Shingles Vaccine? No   Zostavax completed Yes   Shingrix Completed?: Yes  Screening Tests Health Maintenance  Topic Date Due  INFLUENZA VACCINE  03/08/2023   DTaP/Tdap/Td (2 - Td or Tdap) 08/12/2024 (Originally 05/17/2021)   MAMMOGRAM  09/20/2023   Medicare Annual Wellness  (AWV)  08/22/2024   Colonoscopy  10/25/2026   Pneumonia Vaccine 70+ Years old  Completed   DEXA SCAN  Completed   COVID-19 Vaccine  Completed   Hepatitis C Screening  Completed   Zoster Vaccines- Shingrix  Completed   HPV VACCINES  Aged Out    Health Maintenance  Health Maintenance Due  Topic Date Due   INFLUENZA VACCINE  03/08/2023    Colorectal cancer screening: Type of screening: Colonoscopy. Completed 2023. Repeat every 5 years  Mammogram status: Completed  . Repeat every year  Bone Density status: Ordered  . Pt provided with contact info and advised to call to schedule appt.  Lung Cancer Screening: (Low Dose CT Chest recommended if Age 2-80 years, 20 pack-year currently smoking OR have quit w/in 15years.) does not qualify.   Lung Cancer Screening Referral:   Additional Screening:  Hepatitis C Screening: does not qualify; Completed 2018  Vision Screening: Recommended annual ophthalmology exams for early detection of glaucoma and other disorders of the eye. Is the patient up to date with their annual eye exam?  Yes  Who is the provider or what is the name of the office in which the patient attends annual eye exams? [facility]  If pt is not established with a provider, would they like to be referred to a provider to establish care? No .   Dental Screening: Recommended annual dental exams for proper oral hygiene    Community Resource Referral / Chronic Care Management: CRR required this visit?  No   CCM required this visit?  No     Plan:     I have personally reviewed and noted the following in the patient's chart:   Medical and social history Use of alcohol, tobacco or illicit drugs  Current medications and supplements including opioid prescriptions. Patient is not currently taking opioid prescriptions. Functional ability and status Nutritional status Physical activity Advanced directives List of other physicians Hospitalizations, surgeries,  and ER visits in previous 12 months Vitals Screenings to include cognitive, depression, and falls Referrals and appointments  In addition, I have reviewed and discussed with patient certain preventive protocols, quality metrics, and best practice recommendations. A written personalized care plan for preventive services as well as general preventive health recommendations were provided to patient.     Remi Haggard, LPN   [id number]   After Visit Summary: (MyChart) Due to this being a telephonic visit, the after visit summary with patients personalized plan was offered to patient via MyChart   Nurse Notes:  Unable to find documentation for flu -shot.  Patient stated she [facility] at [facility]

## 2023-08-31 ENCOUNTER — Encounter: Payer: Self-pay | Admitting: Family Medicine

## 2023-09-11 ENCOUNTER — Other Ambulatory Visit: Payer: Self-pay | Admitting: Family Medicine

## 2023-09-11 DIAGNOSIS — Z1231 Encounter for screening mammogram for malignant neoplasm of breast: Secondary | ICD-10-CM

## 2023-09-27 ENCOUNTER — Telehealth: Payer: Self-pay | Admitting: Family Medicine

## 2023-09-27 MED ORDER — ESOMEPRAZOLE MAGNESIUM 20 MG PO CPDR: DELAYED_RELEASE_CAPSULE | ORAL | 0 refills | Status: DC

## 2023-09-27 NOTE — Telephone Encounter (Signed)
Prescription Request  09/27/2023  LOV: 11/13/2022  What is the name of the medication or equipment?   esomeprazole (NEXIUM) 20 MG capsule   Have you contacted your pharmacy to request a refill? Yes   Which pharmacy would you like this sent to?  [id number] - [place], [place] - [address] AT [facility] [address] [place] [address] Phone: [phone] Fax: [phone]    Patient notified that their request is being sent to the clinical staff for review and that they should receive a response within 2 business days.   Please advise pharmacist.

## 2023-10-02 ENCOUNTER — Ambulatory Visit
Admission: RE | Admit: 2023-10-02 | Discharge: 2023-10-02 | Disposition: A | Discharge status: Home / Self Care | Payer: Medicare PPO | Source: Ambulatory Visit | Attending: Family Medicine | Admitting: Family Medicine

## 2023-10-02 DIAGNOSIS — Z1231 Encounter for screening mammogram for malignant neoplasm of breast: Secondary | ICD-10-CM | POA: Diagnosis not present

## 2023-10-12 ENCOUNTER — Encounter: Payer: Medicare PPO | Admitting: Family Medicine

## 2023-10-31 ENCOUNTER — Other Ambulatory Visit: Payer: Self-pay | Admitting: Family Medicine

## 2023-11-01 NOTE — Telephone Encounter (Signed)
 Requested medication (s) are due for refill today: yes   Requested medication (s) are on the active medication list: yes   Last refill:  07/26/23 #90 0 refills   Future visit scheduled: no   Notes to clinic:  no refills remain. LOV 10/12/22. Protocol failed last labs 10/12/22. Do you want to refill Rx?     Requested Prescriptions  Pending Prescriptions Disp Refills   losartan (COZAAR) 100 MG tablet [Pharmacy Med Name: LOSARTAN POTASSIUM 100 MG TAB] 90 tablet 0    Sig: TAKE 1 TABLET BY MOUTH EVERY DAY     Cardiovascular:  Angiotensin Receptor Blockers Failed - 11/01/2023  1:47 PM      Failed - Cr in normal range and within 180 days    Creat  Date Value Ref Range Status  10/12/2022 0.82 0.60 - 1.00 mg/dL Final         Failed - K in normal range and within 180 days    Potassium  Date Value Ref Range Status  10/12/2022 3.8 3.5 - 5.3 mmol/L Final         Passed - Patient is not pregnant      Passed - Last BP in normal range    BP Readings from Last 1 Encounters:  11/13/22 132/82         Passed - Valid encounter within last 6 months    Recent Outpatient Visits           11 months ago Seasonal allergic rhinitis, unspecified trigger   [facility] Donita Brooks, MD   1 year ago Encounter for Medicare annual wellness exam   [facility] Pickard, Priscille Heidelberg, MD

## 2023-11-08 ENCOUNTER — Other Ambulatory Visit: Payer: Self-pay | Admitting: Family Medicine

## 2023-11-09 NOTE — Telephone Encounter (Signed)
 Unable to refill per protocol, appointment needed.   Requested Prescriptions  Pending Prescriptions Disp Refills   losartan (COZAAR) 100 MG tablet [Pharmacy Med Name: LOSARTAN POTASSIUM 100 MG TAB] 90 tablet 0    Sig: TAKE 1 TABLET BY MOUTH EVERY DAY     Cardiovascular:  Angiotensin Receptor Blockers Failed - 11/09/2023 12:30 PM      Failed - Cr in normal range and within 180 days    Creat  Date Value Ref Range Status  10/12/2022 0.82 0.60 - 1.00 mg/dL Final         Failed - K in normal range and within 180 days    Potassium  Date Value Ref Range Status  10/12/2022 3.8 3.5 - 5.3 mmol/L Final         Passed - Patient is not pregnant      Passed - Last BP in normal range    BP Readings from Last 1 Encounters:  11/13/22 132/82         Passed - Valid encounter within last 6 months    Recent Outpatient Visits           12 months ago Seasonal allergic rhinitis, unspecified trigger   [facility] Donita Brooks, MD   1 year ago Encounter for Medicare annual wellness exam   [facility] Pickard, Priscille Heidelberg, MD

## 2023-12-25 ENCOUNTER — Other Ambulatory Visit: Payer: Self-pay | Admitting: Family Medicine

## 2023-12-25 NOTE — Telephone Encounter (Signed)
 Prescription Request  12/25/2023  LOV: Visit date not found  What is the name of the medication or equipment? esomeprazole  (NEXIUM ) 20 MG capsule   Have you contacted your pharmacy to request a refill? Yes   Which pharmacy would you like this sent to?  [id number] - [place], [place] - [address] AT [facility] [address] [place] [address] Phone: [phone] Fax: [phone]    Patient notified that their request is being sent to the clinical staff for review and that they should receive a response within 2 business days.   Please advise at [facility] [phone]

## 2023-12-26 NOTE — Telephone Encounter (Signed)
 Requested medication (s) are due for refill today - yes  Requested medication (s) are on the active medication list -yes  Future visit scheduled -no  Last refill: 07/26/23 #90  Notes to clinic: fails lab protocol- over 1 year- 10/12/22  Requested Prescriptions  Pending Prescriptions Disp Refills   losartan  (COZAAR ) 100 MG tablet [Pharmacy Med Name: LOSARTAN  POTASSIUM 100 MG TAB] 90 tablet 0    Sig: TAKE 1 TABLET BY MOUTH EVERY DAY     Cardiovascular:  Angiotensin Receptor Blockers Failed - 12/26/2023  4:18 PM      Failed - Cr in normal range and within 180 days    Creat  Date Value Ref Range Status  10/12/2022 0.82 0.60 - 1.00 mg/dL Final         Failed - K in normal range and within 180 days    Potassium  Date Value Ref Range Status  10/12/2022 3.8 3.5 - 5.3 mmol/L Final         Passed - Patient is not pregnant      Passed - Last BP in normal range    BP Readings from Last 1 Encounters:  11/13/22 132/82         Passed - Valid encounter within last 6 months    Recent Outpatient Visits           1 year ago Seasonal allergic rhinitis, unspecified trigger   [place] [facility] Austine Lefort, MD   1 year ago Encounter for Medicare annual wellness exam   [facility] Austine Lefort, MD                 Requested Prescriptions  Pending Prescriptions Disp Refills   losartan  (COZAAR ) 100 MG tablet [Pharmacy Med Name: LOSARTAN  POTASSIUM 100 MG TAB] 90 tablet 0    Sig: TAKE 1 TABLET BY MOUTH EVERY DAY     Cardiovascular:  Angiotensin Receptor Blockers Failed - 12/26/2023  4:18 PM      Failed - Cr in normal range and within 180 days    Creat  Date Value Ref Range Status  10/12/2022 0.82 0.60 - 1.00 mg/dL Final         Failed - K in normal range and within 180 days    Potassium  Date Value Ref Range Status  10/12/2022 3.8 3.5 - 5.3 mmol/L Final         Passed - Patient is not pregnant      Passed - Last  BP in normal range    BP Readings from Last 1 Encounters:  11/13/22 132/82         Passed - Valid encounter within last 6 months    Recent Outpatient Visits           1 year ago Seasonal allergic rhinitis, unspecified trigger   [place] [facility] Austine Lefort, MD   1 year ago Encounter for Medicare annual wellness exam   [facility] Pickard, Cisco Crest, MD

## 2023-12-27 NOTE — Telephone Encounter (Signed)
 Requested medication (s) are due for refill today: yes  Requested medication (s) are on the active medication list: yes  Last refill:  09/27/23 #90 0 refills  Future visit scheduled: no   Notes to clinic:  no refills remain. Last labs 10/12/22. Do you want to continue Rx refills     Requested Prescriptions  Pending Prescriptions Disp Refills   esomeprazole  (NEXIUM ) 20 MG capsule 90 capsule 0    Sig: TAKE 1 CAPSULE BY MOUTH EVERY DAY AT 12 NOON     Gastroenterology: Proton Pump Inhibitors 2 Failed - 12/27/2023 11:02 AM      Failed - ALT in normal range and within 360 days    ALT  Date Value Ref Range Status  10/12/2022 17 6 - 29 U/L Final         Failed - AST in normal range and within 360 days    AST  Date Value Ref Range Status  10/12/2022 15 10 - 35 U/L Final         Passed - Valid encounter within last 12 months    Recent Outpatient Visits           1 year ago Seasonal allergic rhinitis, unspecified trigger   [place] [facility] Pickard, Cisco Crest, MD   1 year ago Encounter for Medicare annual wellness exam   [place] [facility] Pickard, Cisco Crest, MD

## 2023-12-28 ENCOUNTER — Other Ambulatory Visit: Payer: Self-pay

## 2023-12-28 NOTE — Telephone Encounter (Signed)
 Prescription Request  12/28/2023  LOV: 08/23/23  What is the name of the medication or equipment? esomeprazole  (NEXIUM ) 20 MG capsule [[id number]]   Have you contacted your pharmacy to request a refill? Yes   Which pharmacy would you like this sent to?  [id number] - [place], [place] - [address] AT [facility] [address] [place] [address] Phone: [phone] Fax: [phone]    Patient notified that their request is being sent to the clinical staff for review and that they should receive a response within 2 business days.   Please advise at [facility] [phone]

## 2024-01-01 DIAGNOSIS — H2512 Age-related nuclear cataract, left eye: Secondary | ICD-10-CM | POA: Diagnosis not present

## 2024-01-01 DIAGNOSIS — H401131 Primary open-angle glaucoma, bilateral, mild stage: Secondary | ICD-10-CM | POA: Diagnosis not present

## 2024-01-01 NOTE — Telephone Encounter (Signed)
 OFFICE VISIT NEEDED FOR ADDITIONAL REFILLS.  Requested Prescriptions  Pending Prescriptions Disp Refills   esomeprazole  (NEXIUM ) 20 MG capsule 90 capsule 0    Sig: TAKE 1 CAPSULE BY MOUTH EVERY DAY AT 12 NOON     Gastroenterology: Proton Pump Inhibitors 2 Failed - 01/01/2024 12:34 PM      Failed - ALT in normal range and within 360 days    ALT  Date Value Ref Range Status  10/12/2022 17 6 - 29 U/L Final         Failed - AST in normal range and within 360 days    AST  Date Value Ref Range Status  10/12/2022 15 10 - 35 U/L Final         Passed - Valid encounter within last 12 months    Recent Outpatient Visits           1 year ago Seasonal allergic rhinitis, unspecified trigger   [facility] Pickard, Cisco Crest, MD   1 year ago Encounter for Medicare annual wellness exam   [place] Pickard, Cisco Crest, MD

## 2024-03-27 ENCOUNTER — Other Ambulatory Visit: Payer: Self-pay | Admitting: Family Medicine

## 2024-03-28 ENCOUNTER — Other Ambulatory Visit: Payer: Self-pay

## 2024-03-28 DIAGNOSIS — R0600 Dyspnea, unspecified: Secondary | ICD-10-CM

## 2024-03-28 DIAGNOSIS — I10 Essential (primary) hypertension: Secondary | ICD-10-CM

## 2024-03-28 NOTE — Telephone Encounter (Signed)
 Requested medication (s) are due for refill today - yes  Requested medication (s) are on the active medication list -yes  Future visit scheduled -no  Last refill: 07/26/23 #90  Notes to clinic: fails lab protocol- over 1 year- 10/12/22  Requested Prescriptions  Pending Prescriptions Disp Refills   losartan  (COZAAR ) 100 MG tablet [Pharmacy Med Name: LOSARTAN  POTASSIUM 100 MG TAB] 90 tablet 0    Sig: TAKE 1 TABLET BY MOUTH EVERY DAY     Cardiovascular:  Angiotensin Receptor Blockers Failed - 03/28/2024  3:21 PM      Failed - Cr in normal range and within 180 days    Creat  Date Value Ref Range Status  10/12/2022 0.82 0.60 - 1.00 mg/dL Final         Failed - K in normal range and within 180 days    Potassium  Date Value Ref Range Status  10/12/2022 3.8 3.5 - 5.3 mmol/L Final         Failed - Valid encounter within last 6 months    Recent Outpatient Visits           1 year ago Seasonal allergic rhinitis, unspecified trigger   [place] [facility] Duanne Butler DASEN, MD   1 year ago Encounter for Medicare annual wellness exam   [facility] Duanne Butler DASEN, MD              Passed - Patient is not pregnant      Passed - Last BP in normal range    BP Readings from Last 1 Encounters:  11/13/22 132/82            Requested Prescriptions  Pending Prescriptions Disp Refills   losartan  (COZAAR ) 100 MG tablet [Pharmacy Med Name: LOSARTAN  POTASSIUM 100 MG TAB] 90 tablet 0    Sig: TAKE 1 TABLET BY MOUTH EVERY DAY     Cardiovascular:  Angiotensin Receptor Blockers Failed - 03/28/2024  3:21 PM      Failed - Cr in normal range and within 180 days    Creat  Date Value Ref Range Status  10/12/2022 0.82 0.60 - 1.00 mg/dL Final         Failed - K in normal range and within 180 days    Potassium  Date Value Ref Range Status  10/12/2022 3.8 3.5 - 5.3 mmol/L Final         Failed - Valid encounter within last 6 months     Recent Outpatient Visits           1 year ago Seasonal allergic rhinitis, unspecified trigger   [place] [facility] Duanne Butler DASEN, MD   1 year ago Encounter for Medicare annual wellness exam   [place] [facility] Duanne Butler DASEN, MD              Passed - Patient is not pregnant      Passed - Last BP in normal range    BP Readings from Last 1 Encounters:  11/13/22 132/82

## 2024-03-28 NOTE — Telephone Encounter (Addendum)
 Prescription Request  03/28/2024  LOV: 11/13/22  What is the name of the medication or equipment? furosemide  (LASIX ) 40 MG tablet [[id number]]   Have you contacted your pharmacy to request a refill? Yes   Which pharmacy would you like this sent to?  [id number] [place], [place] - [address] AT [facility] [address] [place] [address] Phone: [phone] Fax: [phone]    Patient notified that their request is being sent to the clinical staff for review and that they should receive a response within 2 business days.   Please advise at [facility] [phone]  Prescription Request  03/28/2024  LOV: 11/13/22  What is the name of the medication or equipment? esomeprazole  (NEXIUM ) 20 MG capsule [[id number]]   Have you contacted your pharmacy to request a refill? Yes   Which pharmacy would you like this sent to?  [id number] [place]  - [address] AT [facility] [address] [place]  [address] Phone: [phone] Fax: [phone]    Patient notified that their request is being sent to the clinical staff for review and that they should receive a response within 2 business days.   Please advise at [facility] [phone]

## 2024-03-28 NOTE — Telephone Encounter (Signed)
 Requested medications are due for refill today.  Unsure  - pt is prescribed 2 diuretics  Requested medications are on the active medications list.  yes  Last refill. 08/02/2023 #90 1 rf  Future visit scheduled.   no  Notes to clinic.  Labs are expired. Pt need and ov    Requested Prescriptions  Pending Prescriptions Disp Refills   furosemide  (LASIX ) 40 MG tablet 90 tablet 1    Sig: Take 1 tablet (40 mg total) by mouth daily.     Cardiovascular:  Diuretics - Loop Failed - 03/28/2024  5:32 PM      Failed - K in normal range and within 180 days    Potassium  Date Value Ref Range Status  10/12/2022 3.8 3.5 - 5.3 mmol/L Final         Failed - Ca in normal range and within 180 days    Calcium  Date Value Ref Range Status  10/12/2022 9.2 8.6 - 10.4 mg/dL Final         Failed - Na in normal range and within 180 days    Sodium  Date Value Ref Range Status  10/12/2022 139 135 - 146 mmol/L Final         Failed - Cr in normal range and within 180 days    Creat  Date Value Ref Range Status  10/12/2022 0.82 0.60 - 1.00 mg/dL Final         Failed - Cl in normal range and within 180 days    Chloride  Date Value Ref Range Status  10/12/2022 102 98 - 110 mmol/L Final         Failed - Mg Level in normal range and within 180 days    No results found for: MG       Failed - Valid encounter within last 6 months    Recent Outpatient Visits           1 year ago Seasonal allergic rhinitis, unspecified trigger   [facility] Duanne Butler DASEN, MD   1 year ago Encounter for Medicare annual wellness exam   [facility] Duanne Butler DASEN, MD              Passed - Last BP in normal range    BP Readings from Last 1 Encounters:  11/13/22 132/82

## 2024-04-02 ENCOUNTER — Other Ambulatory Visit: Payer: Self-pay | Admitting: Family Medicine

## 2024-04-02 ENCOUNTER — Other Ambulatory Visit: Payer: Self-pay

## 2024-04-02 ENCOUNTER — Telehealth: Payer: Self-pay | Admitting: Family Medicine

## 2024-04-02 MED ORDER — ESOMEPRAZOLE MAGNESIUM 20 MG PO CPDR: DELAYED_RELEASE_CAPSULE | ORAL | 0 refills | Status: DC

## 2024-04-02 NOTE — Telephone Encounter (Signed)
 Prescription Request  04/02/2024  LOV: Visit date not found  What is the name of the medication or equipment?   esomeprazole  (NEXIUM ) 20 MG capsule [[id number]]  **90 day script requested**  Have you contacted your pharmacy to request a refill? Yes   Which pharmacy would you like this sent to?  [id number] [place], [place] - [address] AT [facility] [address] [place] [address] Phone: [phone] Fax: [phone]    Patient notified that their request is being sent to the clinical staff for review and that they should receive a response within 2 business days.   Please advise pharmacist.

## 2024-04-02 NOTE — Telephone Encounter (Unsigned)
 Copied from CRM [id number]. Topic: Clinical - Medication Refill >> Apr 02, 2024  9:27 AM Amy B wrote: Medication: esomeprazole  (NEXIUM ) 20 MG capsule  Has the patient contacted their pharmacy? Yes (Agent: If no, request that the patient contact the pharmacy for the refill. If patient does not wish to contact the pharmacy document the reason why and proceed with request.) (Agent: If yes, when and what did the pharmacy advise?)  This is the patient's preferred pharmacy:  [id number] [place], [place] - [address] AT [facility] [address] [place] [address] Phone: [phone] Fax: [phone]  Is this the correct pharmacy for this prescription? Yes If no, delete pharmacy and type the correct one.   Has the prescription been filled recently? No  Is the patient out of the medication? Yes  Has the patient been seen for an appointment in the last year OR does the patient have an upcoming appointment? No  Can we respond through MyChart? Yes  Agent: Please be advised that Rx refills may take up to 3 business days. We ask that you follow-up with your pharmacy.

## 2024-04-02 NOTE — Telephone Encounter (Signed)
 Medication sent in.

## 2024-05-01 ENCOUNTER — Other Ambulatory Visit: Payer: Medicare PPO

## 2024-05-01 ENCOUNTER — Inpatient Hospital Stay (HOSPITAL_COMMUNITY): Admission: RE | Admit: 2024-05-01 | Source: Ambulatory Visit

## 2024-05-09 DIAGNOSIS — M1712 Unilateral primary osteoarthritis, left knee: Secondary | ICD-10-CM | POA: Diagnosis not present

## 2024-05-09 DIAGNOSIS — M1711 Unilateral primary osteoarthritis, right knee: Secondary | ICD-10-CM | POA: Diagnosis not present

## 2024-06-11 ENCOUNTER — Other Ambulatory Visit: Payer: Self-pay | Admitting: Family Medicine

## 2024-06-11 DIAGNOSIS — E785 Hyperlipidemia, unspecified: Secondary | ICD-10-CM | POA: Diagnosis not present

## 2024-06-11 DIAGNOSIS — H409 Unspecified glaucoma: Secondary | ICD-10-CM | POA: Diagnosis not present

## 2024-06-11 DIAGNOSIS — Z8249 Family history of ischemic heart disease and other diseases of the circulatory system: Secondary | ICD-10-CM | POA: Diagnosis not present

## 2024-06-11 DIAGNOSIS — R32 Unspecified urinary incontinence: Secondary | ICD-10-CM | POA: Diagnosis not present

## 2024-06-11 DIAGNOSIS — I10 Essential (primary) hypertension: Secondary | ICD-10-CM | POA: Diagnosis not present

## 2024-06-11 DIAGNOSIS — E669 Obesity, unspecified: Secondary | ICD-10-CM | POA: Diagnosis not present

## 2024-06-11 DIAGNOSIS — K219 Gastro-esophageal reflux disease without esophagitis: Secondary | ICD-10-CM | POA: Diagnosis not present

## 2024-06-11 DIAGNOSIS — Z6832 Body mass index (BMI) 32.0-32.9, adult: Secondary | ICD-10-CM | POA: Diagnosis not present

## 2024-07-01 DIAGNOSIS — H524 Presbyopia: Secondary | ICD-10-CM | POA: Diagnosis not present

## 2024-07-01 DIAGNOSIS — H25012 Cortical age-related cataract, left eye: Secondary | ICD-10-CM | POA: Diagnosis not present

## 2024-07-01 DIAGNOSIS — H401131 Primary open-angle glaucoma, bilateral, mild stage: Secondary | ICD-10-CM | POA: Diagnosis not present

## 2024-07-01 DIAGNOSIS — Z961 Presence of intraocular lens: Secondary | ICD-10-CM | POA: Diagnosis not present

## 2024-07-03 ENCOUNTER — Other Ambulatory Visit: Payer: Self-pay | Admitting: Family Medicine
